# Patient Record
Sex: Male | Born: 1955 | Race: White | Hispanic: No | Marital: Married | Smoking: Former smoker
Health system: Southern US, Community
[De-identification: ages and names within clinical notes are randomized; demographics above are authoritative.]

## PROBLEM LIST (undated history)

## (undated) DIAGNOSIS — F4310 Post-traumatic stress disorder, unspecified: Secondary | ICD-10-CM

## (undated) DIAGNOSIS — E669 Obesity, unspecified: Secondary | ICD-10-CM

## (undated) DIAGNOSIS — I2510 Atherosclerotic heart disease of native coronary artery without angina pectoris: Secondary | ICD-10-CM

## (undated) DIAGNOSIS — G510 Bell's palsy: Secondary | ICD-10-CM

## (undated) DIAGNOSIS — IMO0002 Reserved for concepts with insufficient information to code with codable children: Secondary | ICD-10-CM

## (undated) DIAGNOSIS — K859 Acute pancreatitis without necrosis or infection, unspecified: Secondary | ICD-10-CM

## (undated) DIAGNOSIS — G4736 Sleep related hypoventilation in conditions classified elsewhere: Secondary | ICD-10-CM

## (undated) DIAGNOSIS — E785 Hyperlipidemia, unspecified: Secondary | ICD-10-CM

## (undated) DIAGNOSIS — R0600 Dyspnea, unspecified: Secondary | ICD-10-CM

## (undated) DIAGNOSIS — I10 Essential (primary) hypertension: Secondary | ICD-10-CM

## (undated) DIAGNOSIS — G4700 Insomnia, unspecified: Secondary | ICD-10-CM

## (undated) DIAGNOSIS — I6529 Occlusion and stenosis of unspecified carotid artery: Secondary | ICD-10-CM

## (undated) DIAGNOSIS — J302 Other seasonal allergic rhinitis: Secondary | ICD-10-CM

## (undated) HISTORY — PX: TONSILLECTOMY: SUR1361

## (undated) HISTORY — DX: Hyperlipidemia, unspecified: E78.5

## (undated) HISTORY — PX: APPENDECTOMY: SHX54

## (undated) HISTORY — DX: Atherosclerotic heart disease of native coronary artery without angina pectoris: I25.10

## (undated) HISTORY — DX: Other seasonal allergic rhinitis: J30.2

## (undated) HISTORY — PX: LUMBAR DISC SURGERY: SHX700

## (undated) HISTORY — DX: Reserved for concepts with insufficient information to code with codable children: IMO0002

## (undated) HISTORY — DX: Dyspnea, unspecified: R06.00

## (undated) HISTORY — DX: Post-traumatic stress disorder, unspecified: F43.10

## (undated) HISTORY — PX: CARDIAC CATHETERIZATION: SHX172

## (undated) HISTORY — DX: Essential (primary) hypertension: I10

## (undated) HISTORY — DX: Obesity, unspecified: E66.9

## (undated) HISTORY — DX: Bell's palsy: G51.0

## (undated) HISTORY — DX: Occlusion and stenosis of unspecified carotid artery: I65.29

## (undated) HISTORY — DX: Insomnia, unspecified: G47.00

## (undated) HISTORY — DX: Acute pancreatitis without necrosis or infection, unspecified: K85.90

## (undated) HISTORY — PX: CERVICAL SPINE SURGERY: SHX589

## (undated) HISTORY — DX: Sleep related hypoventilation in conditions classified elsewhere: G47.36

---

## 2004-05-07 ENCOUNTER — Encounter: Admission: RE | Admit: 2004-05-07 | Discharge: 2004-05-07 | Payer: Self-pay | Admitting: Internal Medicine

## 2004-05-22 ENCOUNTER — Ambulatory Visit (HOSPITAL_BASED_OUTPATIENT_CLINIC_OR_DEPARTMENT_OTHER): Admission: RE | Admit: 2004-05-22 | Discharge: 2004-05-22 | Payer: Self-pay | Admitting: Internal Medicine

## 2004-07-25 ENCOUNTER — Ambulatory Visit: Payer: Self-pay | Admitting: Internal Medicine

## 2004-10-16 ENCOUNTER — Ambulatory Visit: Payer: Self-pay | Admitting: Family Medicine

## 2005-05-06 ENCOUNTER — Ambulatory Visit: Payer: Self-pay | Admitting: Internal Medicine

## 2005-05-25 ENCOUNTER — Ambulatory Visit: Payer: Self-pay

## 2005-06-08 ENCOUNTER — Ambulatory Visit: Payer: Self-pay | Admitting: Internal Medicine

## 2005-06-22 ENCOUNTER — Ambulatory Visit: Payer: Self-pay | Admitting: Internal Medicine

## 2005-06-23 ENCOUNTER — Ambulatory Visit (HOSPITAL_COMMUNITY): Admission: RE | Admit: 2005-06-23 | Discharge: 2005-06-23 | Payer: Self-pay | Admitting: Internal Medicine

## 2005-07-01 ENCOUNTER — Ambulatory Visit: Payer: Self-pay | Admitting: Internal Medicine

## 2005-08-28 ENCOUNTER — Ambulatory Visit: Payer: Self-pay | Admitting: Internal Medicine

## 2005-10-09 ENCOUNTER — Ambulatory Visit: Payer: Self-pay | Admitting: Internal Medicine

## 2005-11-12 ENCOUNTER — Ambulatory Visit: Payer: Self-pay | Admitting: Internal Medicine

## 2005-11-13 ENCOUNTER — Ambulatory Visit: Payer: Self-pay | Admitting: Internal Medicine

## 2005-12-09 ENCOUNTER — Ambulatory Visit: Payer: Self-pay | Admitting: Internal Medicine

## 2005-12-21 ENCOUNTER — Ambulatory Visit: Payer: Self-pay | Admitting: Internal Medicine

## 2005-12-22 ENCOUNTER — Ambulatory Visit (HOSPITAL_COMMUNITY): Admission: RE | Admit: 2005-12-22 | Discharge: 2005-12-22 | Payer: Self-pay | Admitting: Internal Medicine

## 2006-01-07 ENCOUNTER — Ambulatory Visit: Payer: Self-pay | Admitting: Cardiology

## 2006-01-29 ENCOUNTER — Ambulatory Visit: Payer: Self-pay | Admitting: Internal Medicine

## 2006-05-07 ENCOUNTER — Ambulatory Visit: Payer: Self-pay | Admitting: Internal Medicine

## 2006-05-10 ENCOUNTER — Encounter: Payer: Self-pay | Admitting: Internal Medicine

## 2006-05-20 ENCOUNTER — Ambulatory Visit: Payer: Self-pay | Admitting: Internal Medicine

## 2006-06-02 ENCOUNTER — Encounter: Admission: RE | Admit: 2006-06-02 | Discharge: 2006-06-02 | Payer: Self-pay | Admitting: Neurosurgery

## 2006-06-17 ENCOUNTER — Ambulatory Visit: Payer: Self-pay | Admitting: Internal Medicine

## 2006-06-17 ENCOUNTER — Encounter: Payer: Self-pay | Admitting: Cardiology

## 2006-07-07 ENCOUNTER — Ambulatory Visit: Payer: Self-pay | Admitting: Internal Medicine

## 2006-08-02 ENCOUNTER — Ambulatory Visit: Payer: Self-pay | Admitting: Internal Medicine

## 2006-08-06 ENCOUNTER — Ambulatory Visit: Payer: Self-pay | Admitting: Internal Medicine

## 2006-08-12 ENCOUNTER — Ambulatory Visit (HOSPITAL_COMMUNITY): Admission: RE | Admit: 2006-08-12 | Discharge: 2006-08-12 | Payer: Self-pay | Admitting: Neurosurgery

## 2006-08-20 ENCOUNTER — Ambulatory Visit: Payer: Self-pay | Admitting: Internal Medicine

## 2006-09-01 ENCOUNTER — Encounter: Admission: RE | Admit: 2006-09-01 | Discharge: 2006-09-01 | Payer: Self-pay | Admitting: Neurosurgery

## 2006-10-08 ENCOUNTER — Ambulatory Visit: Payer: Self-pay | Admitting: Internal Medicine

## 2007-01-24 ENCOUNTER — Ambulatory Visit: Payer: Self-pay | Admitting: Internal Medicine

## 2007-01-24 LAB — CONVERTED CEMR LAB
ALT: 48 units/L — ABNORMAL HIGH (ref 0–40)
AST: 28 units/L (ref 0–37)
Albumin: 4.1 g/dL (ref 3.5–5.2)
Alkaline Phosphatase: 52 units/L (ref 39–117)
BUN: 9 mg/dL (ref 6–23)
Bilirubin, Direct: 0.1 mg/dL (ref 0.0–0.3)
CO2: 31 meq/L (ref 19–32)
Calcium: 8.6 mg/dL (ref 8.4–10.5)
Chloride: 109 meq/L (ref 96–112)
Cholesterol: 94 mg/dL (ref 0–200)
Creatinine, Ser: 0.7 mg/dL (ref 0.4–1.5)
GFR calc Af Amer: 153 mL/min
GFR calc non Af Amer: 126 mL/min
Glucose, Bld: 160 mg/dL — ABNORMAL HIGH (ref 70–99)
HDL: 23.4 mg/dL — ABNORMAL LOW (ref >39.0)
LDL Cholesterol: 48 mg/dL (ref 0–99)
Potassium: 4.1 meq/L (ref 3.5–5.1)
Sodium: 144 meq/L (ref 135–145)
Total Bilirubin: 0.6 mg/dL (ref 0.3–1.2)
Total CHOL/HDL Ratio: 4
Total Protein: 6.8 g/dL (ref 6.0–8.3)
Triglycerides: 111 mg/dL (ref 0–149)
VLDL: 22 mg/dL (ref 0–40)

## 2007-02-04 ENCOUNTER — Ambulatory Visit: Payer: Self-pay | Admitting: Internal Medicine

## 2007-02-15 DIAGNOSIS — I6529 Occlusion and stenosis of unspecified carotid artery: Secondary | ICD-10-CM

## 2007-02-15 HISTORY — DX: Occlusion and stenosis of unspecified carotid artery: I65.29

## 2007-02-25 ENCOUNTER — Ambulatory Visit: Payer: Self-pay

## 2007-06-13 ENCOUNTER — Ambulatory Visit: Payer: Self-pay | Admitting: Internal Medicine

## 2007-06-13 LAB — CONVERTED CEMR LAB
ALT: 61 units/L — ABNORMAL HIGH (ref 0–53)
AST: 32 units/L (ref 0–37)
Albumin: 4 g/dL (ref 3.5–5.2)
Alkaline Phosphatase: 51 units/L (ref 39–117)
BUN: 8 mg/dL (ref 6–23)
Bilirubin, Direct: 0.1 mg/dL (ref 0.0–0.3)
CO2: 31 meq/L (ref 19–32)
Calcium: 8.8 mg/dL (ref 8.4–10.5)
Chloride: 104 meq/L (ref 96–112)
Cholesterol: 108 mg/dL (ref 0–200)
Creatinine, Ser: 0.7 mg/dL (ref 0.4–1.5)
GFR calc Af Amer: 153 mL/min
GFR calc non Af Amer: 126 mL/min
Glucose, Bld: 168 mg/dL — ABNORMAL HIGH (ref 70–99)
HDL: 23.9 mg/dL — ABNORMAL LOW (ref >39.0)
LDL Cholesterol: 48 mg/dL (ref 0–99)
Potassium: 3.2 meq/L — ABNORMAL LOW (ref 3.5–5.1)
Sodium: 142 meq/L (ref 135–145)
Total Bilirubin: 0.7 mg/dL (ref 0.3–1.2)
Total CHOL/HDL Ratio: 4.5
Total Protein: 6.8 g/dL (ref 6.0–8.3)
Triglycerides: 183 mg/dL — ABNORMAL HIGH (ref 0–149)
VLDL: 37 mg/dL (ref 0–40)

## 2007-06-15 ENCOUNTER — Ambulatory Visit: Payer: Self-pay | Admitting: Internal Medicine

## 2007-09-19 ENCOUNTER — Ambulatory Visit: Payer: Self-pay | Admitting: Internal Medicine

## 2007-09-19 LAB — CONVERTED CEMR LAB
ALT: 50 units/L (ref 0–53)
AST: 28 units/L (ref 0–37)
Albumin: 4 g/dL (ref 3.5–5.2)
Alkaline Phosphatase: 51 units/L (ref 39–117)
BUN: 9 mg/dL (ref 6–23)
Bilirubin, Direct: 0.1 mg/dL (ref 0.0–0.3)
CO2: 30 meq/L (ref 19–32)
Calcium: 8.7 mg/dL (ref 8.4–10.5)
Chloride: 102 meq/L (ref 96–112)
Cholesterol: 111 mg/dL (ref 0–200)
Creatinine, Ser: 0.7 mg/dL (ref 0.4–1.5)
GFR calc Af Amer: 152 mL/min
GFR calc non Af Amer: 126 mL/min
Glucose, Bld: 230 mg/dL — ABNORMAL HIGH (ref 70–99)
HDL: 29.3 mg/dL — ABNORMAL LOW (ref >39.0)
LDL Cholesterol: 61 mg/dL (ref 0–99)
Potassium: 3.2 meq/L — ABNORMAL LOW (ref 3.5–5.1)
Sodium: 140 meq/L (ref 135–145)
Total Bilirubin: 0.7 mg/dL (ref 0.3–1.2)
Total CHOL/HDL Ratio: 3.8
Total Protein: 6.9 g/dL (ref 6.0–8.3)
Triglycerides: 102 mg/dL (ref 0–149)
VLDL: 20 mg/dL (ref 0–40)

## 2007-09-27 ENCOUNTER — Ambulatory Visit: Payer: Self-pay | Admitting: Internal Medicine

## 2007-09-27 LAB — CONVERTED CEMR LAB
BUN: 10 mg/dL (ref 6–23)
CO2: 32 meq/L (ref 19–32)
Calcium: 9 mg/dL (ref 8.4–10.5)
Chloride: 102 meq/L (ref 96–112)
Creatinine, Ser: 0.7 mg/dL (ref 0.4–1.5)
GFR calc Af Amer: 152 mL/min
GFR calc non Af Amer: 126 mL/min
Glucose, Bld: 181 mg/dL — ABNORMAL HIGH (ref 70–99)
Potassium: 3.6 meq/L (ref 3.5–5.1)
Sodium: 142 meq/L (ref 135–145)

## 2007-10-25 ENCOUNTER — Ambulatory Visit: Payer: Self-pay | Admitting: Internal Medicine

## 2008-05-03 ENCOUNTER — Ambulatory Visit: Payer: Self-pay | Admitting: Internal Medicine

## 2008-05-17 DIAGNOSIS — R0600 Dyspnea, unspecified: Secondary | ICD-10-CM

## 2008-05-17 HISTORY — DX: Dyspnea, unspecified: R06.00

## 2008-05-25 ENCOUNTER — Ambulatory Visit: Payer: Self-pay

## 2008-08-07 ENCOUNTER — Ambulatory Visit: Payer: Self-pay | Admitting: Internal Medicine

## 2008-08-24 ENCOUNTER — Ambulatory Visit: Payer: Self-pay | Admitting: Internal Medicine

## 2008-09-14 ENCOUNTER — Encounter: Payer: Self-pay | Admitting: Neurology

## 2008-09-27 ENCOUNTER — Encounter: Payer: Self-pay | Admitting: Internal Medicine

## 2008-09-28 ENCOUNTER — Encounter: Payer: Self-pay | Admitting: Internal Medicine

## 2008-10-04 ENCOUNTER — Encounter: Payer: Self-pay | Admitting: Internal Medicine

## 2008-12-07 ENCOUNTER — Ambulatory Visit: Payer: Self-pay | Admitting: Internal Medicine

## 2008-12-07 DIAGNOSIS — G4733 Obstructive sleep apnea (adult) (pediatric): Secondary | ICD-10-CM | POA: Insufficient documentation

## 2008-12-11 ENCOUNTER — Encounter: Payer: Self-pay | Admitting: Internal Medicine

## 2008-12-27 ENCOUNTER — Telehealth (INDEPENDENT_AMBULATORY_CARE_PROVIDER_SITE_OTHER): Payer: Self-pay | Admitting: *Deleted

## 2009-01-18 ENCOUNTER — Ambulatory Visit: Payer: Self-pay | Admitting: Internal Medicine

## 2009-01-18 DIAGNOSIS — I10 Essential (primary) hypertension: Secondary | ICD-10-CM | POA: Insufficient documentation

## 2009-03-22 ENCOUNTER — Encounter: Payer: Self-pay | Admitting: Internal Medicine

## 2009-05-10 ENCOUNTER — Ambulatory Visit: Payer: Self-pay | Admitting: Internal Medicine

## 2009-05-10 DIAGNOSIS — I6529 Occlusion and stenosis of unspecified carotid artery: Secondary | ICD-10-CM | POA: Insufficient documentation

## 2009-05-17 ENCOUNTER — Ambulatory Visit: Payer: Self-pay

## 2009-05-22 ENCOUNTER — Encounter: Payer: Self-pay | Admitting: Internal Medicine

## 2009-07-17 DIAGNOSIS — G510 Bell's palsy: Secondary | ICD-10-CM

## 2009-07-17 HISTORY — DX: Bell's palsy: G51.0

## 2009-07-29 ENCOUNTER — Ambulatory Visit (HOSPITAL_COMMUNITY): Admission: RE | Admit: 2009-07-29 | Discharge: 2009-07-29 | Payer: Self-pay | Admitting: Internal Medicine

## 2009-11-01 ENCOUNTER — Telehealth: Payer: Self-pay | Admitting: Internal Medicine

## 2009-11-08 ENCOUNTER — Ambulatory Visit: Payer: Self-pay | Admitting: Internal Medicine

## 2009-11-08 DIAGNOSIS — F329 Major depressive disorder, single episode, unspecified: Secondary | ICD-10-CM | POA: Insufficient documentation

## 2009-11-14 ENCOUNTER — Ambulatory Visit: Payer: Self-pay | Admitting: Psychology

## 2009-11-18 ENCOUNTER — Telehealth: Payer: Self-pay | Admitting: Internal Medicine

## 2009-11-19 ENCOUNTER — Telehealth: Payer: Self-pay | Admitting: Internal Medicine

## 2009-11-22 ENCOUNTER — Ambulatory Visit: Payer: Self-pay | Admitting: Internal Medicine

## 2009-11-22 ENCOUNTER — Ambulatory Visit (HOSPITAL_COMMUNITY): Admission: RE | Admit: 2009-11-22 | Discharge: 2009-11-22 | Payer: Self-pay | Admitting: Internal Medicine

## 2009-11-22 ENCOUNTER — Encounter: Payer: Self-pay | Admitting: Internal Medicine

## 2009-11-22 ENCOUNTER — Ambulatory Visit: Payer: Self-pay

## 2009-11-25 LAB — CONVERTED CEMR LAB
BUN: 14 mg/dL (ref 6–23)
CO2: 25 meq/L (ref 19–32)
Calcium: 8.8 mg/dL (ref 8.4–10.5)
Chloride: 103 meq/L (ref 96–112)
Creatinine, Ser: 0.97 mg/dL (ref 0.40–1.50)
Glucose, Bld: 210 mg/dL — ABNORMAL HIGH (ref 70–99)
Potassium: 3.7 meq/L (ref 3.5–5.3)
Sodium: 140 meq/L (ref 135–145)

## 2009-11-27 ENCOUNTER — Ambulatory Visit: Payer: Self-pay | Admitting: Psychology

## 2009-11-28 ENCOUNTER — Encounter (INDEPENDENT_AMBULATORY_CARE_PROVIDER_SITE_OTHER): Payer: Self-pay | Admitting: *Deleted

## 2009-12-04 ENCOUNTER — Telehealth: Payer: Self-pay | Admitting: Internal Medicine

## 2009-12-20 ENCOUNTER — Ambulatory Visit: Payer: Self-pay | Admitting: Psychology

## 2010-01-31 ENCOUNTER — Ambulatory Visit: Payer: Self-pay | Admitting: Internal Medicine

## 2010-01-31 DIAGNOSIS — E785 Hyperlipidemia, unspecified: Secondary | ICD-10-CM | POA: Insufficient documentation

## 2010-02-13 ENCOUNTER — Telehealth: Payer: Self-pay | Admitting: Internal Medicine

## 2010-04-22 ENCOUNTER — Telehealth: Payer: Self-pay | Admitting: Internal Medicine

## 2010-06-04 ENCOUNTER — Encounter: Payer: Self-pay | Admitting: Internal Medicine

## 2010-06-05 ENCOUNTER — Ambulatory Visit: Payer: Self-pay | Admitting: Internal Medicine

## 2010-06-05 ENCOUNTER — Encounter (INDEPENDENT_AMBULATORY_CARE_PROVIDER_SITE_OTHER): Payer: Self-pay | Admitting: *Deleted

## 2010-06-05 DIAGNOSIS — R0789 Other chest pain: Secondary | ICD-10-CM | POA: Insufficient documentation

## 2010-06-05 DIAGNOSIS — I25119 Atherosclerotic heart disease of native coronary artery with unspecified angina pectoris: Secondary | ICD-10-CM

## 2010-06-06 ENCOUNTER — Encounter: Payer: Self-pay | Admitting: Internal Medicine

## 2010-06-06 ENCOUNTER — Ambulatory Visit: Payer: Self-pay

## 2010-06-07 ENCOUNTER — Encounter: Payer: Self-pay | Admitting: Cardiology

## 2010-06-13 ENCOUNTER — Ambulatory Visit: Payer: Self-pay | Admitting: Cardiology

## 2010-06-13 ENCOUNTER — Ambulatory Visit (HOSPITAL_COMMUNITY)
Admission: RE | Admit: 2010-06-13 | Discharge: 2010-06-14 | Payer: Self-pay | Source: Home / Self Care | Admitting: Internal Medicine

## 2010-06-18 ENCOUNTER — Encounter: Payer: Self-pay | Admitting: Internal Medicine

## 2010-06-20 ENCOUNTER — Encounter: Payer: Self-pay | Admitting: Internal Medicine

## 2010-06-23 ENCOUNTER — Telehealth: Payer: Self-pay | Admitting: Internal Medicine

## 2010-07-07 ENCOUNTER — Ambulatory Visit: Payer: Self-pay | Admitting: Physician Assistant

## 2010-07-07 DIAGNOSIS — E119 Type 2 diabetes mellitus without complications: Secondary | ICD-10-CM | POA: Insufficient documentation

## 2010-07-14 ENCOUNTER — Encounter: Payer: Self-pay | Admitting: Internal Medicine

## 2010-07-28 ENCOUNTER — Telehealth: Payer: Self-pay | Admitting: Internal Medicine

## 2010-09-05 ENCOUNTER — Ambulatory Visit
Admission: RE | Admit: 2010-09-05 | Discharge: 2010-09-05 | Payer: Self-pay | Source: Home / Self Care | Attending: Internal Medicine | Admitting: Internal Medicine

## 2010-09-05 ENCOUNTER — Encounter: Payer: Self-pay | Admitting: Internal Medicine

## 2010-09-07 ENCOUNTER — Encounter: Payer: Self-pay | Admitting: Internal Medicine

## 2010-09-15 ENCOUNTER — Emergency Department (HOSPITAL_COMMUNITY)
Admission: EM | Admit: 2010-09-15 | Discharge: 2010-09-15 | Payer: Self-pay | Source: Home / Self Care | Admitting: Emergency Medicine

## 2010-09-15 LAB — BASIC METABOLIC PANEL
Chloride: 103 mEq/L (ref 96–112)
GFR calc Af Amer: >60 mL/min (ref >60)
GFR calc non Af Amer: >60 mL/min (ref >60)
Potassium: 3.6 mEq/L (ref 3.5–5.1)
Sodium: 143 mEq/L (ref 135–145)

## 2010-09-15 LAB — CK TOTAL AND CKMB (NOT AT ARMC)
CK, MB: 4.4 ng/mL — ABNORMAL HIGH (ref 0.3–4.0)
Relative Index: 2.8 — ABNORMAL HIGH (ref 0.0–2.5)
Total CK: 158 U/L (ref 7–232)
Total CK: 135 U/L (ref 7–232)

## 2010-09-15 LAB — DIFFERENTIAL
Basophils Absolute: 0 10*3/uL (ref 0.0–0.1)
Basophils Relative: 0 % (ref 0–1)
Eosinophils Relative: 6 % — ABNORMAL HIGH (ref 0–5)
Lymphocytes Relative: 22 % (ref 12–46)
Lymphs Abs: 1.8 10*3/uL (ref 0.7–4.0)
Monocytes Absolute: 0.6 10*3/uL (ref 0.1–1.0)
Monocytes Relative: 7 % (ref 3–12)
Neutro Abs: 5.5 10*3/uL (ref 1.7–7.7)
Neutrophils Relative %: 65 % (ref 43–77)

## 2010-09-15 LAB — CBC
HCT: 39.5 % (ref 39.0–52.0)
Hemoglobin: 13 g/dL (ref 13.0–17.0)
MCH: 26.2 pg (ref 26.0–34.0)
MCHC: 32.9 g/dL (ref 30.0–36.0)
Platelets: 242 10*3/uL (ref 150–400)
RBC: 4.97 MIL/uL (ref 4.22–5.81)
WBC: 8.4 10*3/uL (ref 4.0–10.5)

## 2010-09-15 LAB — PROTIME-INR
INR: 1.03 (ref 0.00–1.49)
Prothrombin Time: 13.7 seconds (ref 11.6–15.2)

## 2010-09-15 LAB — TROPONIN I: Troponin I: <0.01 ng/mL (ref 0.00–0.06)

## 2010-09-17 NOTE — Miscellaneous (Signed)
Summary: [facility] Progress Report   [facility] Progress Report   Imported By: Sallee Provencal 06/30/2010 14:58:00  _____________________________________________________________________  External Attachment:    Type:   Image     Comment:   External Document

## 2010-09-17 NOTE — Progress Notes (Signed)
Summary: elevated BP   Phone Note Call from Patient Call back at [phone]   Caller: Patient Reason for Call: Talk to Nurse Summary of Call: elevated bp.... sent readings in a couple weeks ago and has not heard anything. request call back asap Initial call taken by: Darnell Level,  November 01, 2009 9:15 AM  Follow-up for Phone Call        pt states he has sent Dr Haroldine Laws an email about his BP has not heard anything, advised Dr Haroldine Laws was on vacation and if he was having a problem calling the office would be the best thing to do.  Pt reports readings running 180/100, 190/90, and 167/100, he has been using a lot of extra clonidine and lasix to get it under control.  He states he took an extra lasix last pm and today his BP was 116/80,  Reviewed pts med list w/him and it is correct, he would like an appt to discuss w/Dr Bensimhon, pt sch an appt for 3/25 at [facility], RN  November 01, 2009 9:51 AM      Appended Document: elevated BP i have not gotten any emails recently. i will f/u with him.

## 2010-09-17 NOTE — Progress Notes (Signed)
Summary: had ultrasound at his office-anseurysm   Phone Note Call from Patient Call back at Home Phone [phone]   Caller: Patient [phone] Reason for Call: Talk to Nurse, Lab or Test Results Summary of Call: per pt calling, pt having a ultrasound at his office today, it reveal pt has a aneurysm at the sight where ts went in. pt would like a call back today. advise you can interpert him while he's in clinic.  Initial call taken by: Neil Crouch,  June 23, 2010 11:19 AM Caller: Receptionist  Follow-up for Phone Call        Left message to call back Kevan Rosebush, RN  June 23, 2010 2:22 PM   PT RETURNING CALL  CAN HAVE SOMEONE TO COME GET THE PT  .SIGN  Additional Follow-up for Phone Call Additional follow up Details #1::        Dr  Haroldine Laws has spoken w/pt and discussed Kevan Rosebush, RN  June 23, 2010 6:03 PM

## 2010-09-17 NOTE — Progress Notes (Signed)
Summary: [facility] At [facility] Vitals   [facility] By: Sallee Provencal 11/15/2009 11:17:00  _____________________________________________________________________  External Attachment:    Type:   Image     Comment:   External Document

## 2010-09-17 NOTE — Miscellaneous (Signed)
Summary: [place] Physician Order/Treatment Plan   [facility] Physician Order/Treatment Plan   Imported By: Sallee Provencal 06/30/2010 14:52:05  _____________________________________________________________________  External Attachment:    Type:   Image     Comment:   External Document

## 2010-09-17 NOTE — Progress Notes (Signed)
Summary: refill -- clonidine   Phone Note Refill Request   Refills Requested: Medication #1:  CLONIDINE HCL 0.2 MG TABS four times a day   [facility]ed[facility] [address]   Method Requested: Fax to [place] Initial call taken by: Darnell Level,  November 18, 2009 12:15 PM Caller: Patient Reason for Call: Talk to Nurse    Prescriptions: CLONIDINE HCL 0.2 MG TABS (CLONIDINE HCL) three times a day  #90 Tablet x 5   Entered by:   Mignon Pine, RMA   Authorized by:   Jolaine Artist, MD, [facility]   Signed by:   Mignon Pine, RMA on 11/18/2009   Method used:   Electronically to        [id number] (retail)       [place]       [place], [place]  [address]       Ph: [phone] or [phone]       Fax: [phone]   RxID:   [id number]

## 2010-09-17 NOTE — Letter (Signed)
Summary: Cardiac Catheterization Instructions- Main Lab  [place], [place]  [address] [place]   [place], [place] [address]   Phone: [phone]  Fax: [phone]     06/05/2010 MRN: [id number]  [facility] [place], [place]  [address]  Dear Mr. Trevathan,   You are scheduled for Cardiac Catheterization on Friday 06/13/10             with Dr. Haroldine Laws.  Please arrive at the [facility] at 2:00     p.m. on the day of your procedure.  1. DIET     _x___ You may have clear liquids until 10:00am the morning of your procedure, after that, nothing to eat or drink except medications.  2. Come to the [place] office on (N/A- labs will be drawn at the hospital on your procedure day)    for lab work.  The lab at [facility] is open from 8:30 a.m. to 1:30 p.m. and 2:30 p.m. to 5:00 p.m.  The lab at [facility] [facility] is open from 7:30 a.m. to 5:30 p.m.  You do not have to be fasting.  3. MAKE SURE YOU TAKE YOUR ASPIRIN.  4. __x___ DO NOT TAKE these medications the morning of your procedure:         1) hold lasix, 2) hold glucovance the morning of your procedure and 48 hours after, 3) take a 1/2 of your usual insulin dose the morning of your procedure.      _x___ YOU MAY TAKE ALL of your remaining medications with a small amount of water.     5. Plan for one night stay - bring personal belongings (i.e. toothpaste, toothbrush, etc.)  6. Bring a current list of your medications and current insurance cards.  7. Must have a responsible person to drive you home.   8. Someone must be with you for the first 24 hours after you arrive home.  9. Please wear clothes that are easy to get on and off and wear slip-on shoes.  *Special note: Every effort is made to have your procedure done on time.  Occasionally there are emergencies that present themselves at the hospital that may cause delays.  Please be patient if a  delay does occur.  If you have any questions after you get home, please call the office at the number listed above.  Alvis Lemmings, RN, BSN

## 2010-09-17 NOTE — Assessment & Plan Note (Signed)
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  He has a history of hypertension, diabetes, hyperlipidemia, OSA and obesity.    Echo 4/11 with normal LV function, grade 2 diastolic dysfunction and no pericardial effusion.  Also recently has been having some chest twinges and has been interested in discussiing cardiac catheterization.   Very concerned about hearing bruit like sounds in both ears only when he exercise. Saw ENT and had several MRIs/MRAs with only mild plaque. Continues with chest twinges/pain. Very concerned about safety of exercise. BP high at times. Sleeping better after cutting back lunesta. Compliant with meds.      Current Medications (verified): 1)  Minoxidil 10 Mg Tabs (Minoxidil) .... Two Times A Day 2)  Lotrel 10-40 Mg Caps (Amlodipine Besy-Benazepril Hcl) .... Once Daily 3)  Coreg Cr 40 Mg Xr24h-Cap (Carvedilol Phosphate) .... Once Daily 4)  Clonidine Hcl 0.2 Mg Tabs (Clonidine Hcl) .Marland Kitchen.. 1 Tab in Am, 1 Tab At Noon, and 2 Tabs At Bedtime 5)  Furosemide 40 Mg Tabs (Furosemide) .... Take One Tablet By Mouth Daily. 6)  Klor-Con M20 20 Meq Cr-Tabs (Potassium Chloride Crys Cr) .Marland Kitchen.. 1 By Mouth As Needed With Lasix 7)  Lunesta 3 Mg Tabs (Eszopiclone) .... At Bedtime As Needed 8)  Glucovance 2.5-500 Mg Tabs (Glyburide-Metformin) .... 2 By Mouth Two Times A Day 9)  Novolog Mix 70/30 70-30 % Susp (Insulin Aspart Prot & Aspart) .... 75 Units Two Times A Day 10)  Bayer Aspirin Ec Low Dose 81 Mg Tbec (Aspirin) .... Take 1 By Mouth Once Daily 11)  Klonopin 0.5 Mg Tabs (Clonazepam) .... Take 1 By Mouth Two Times A Day 12)  Oxygen 2liters .... Sleep 13)  Hyzaar 100-25 Mg Tabs (Losartan Potassium-Hctz) .... Once Daily 14)  Fish Oil 1000 Mg Caps  (Omega-3 Fatty Acids) .... 2 Cap Once Daily 15)  Lipitor 20 Mg Tabs (Atorvastatin Calcium) .... Take One Tablet By Mouth Daily. 16)  Super B Complex .Marland Kitchen.. 1 Tab Once Daily  Allergies (verified): 1)  ! Morphine 2)  ! Augmentin 3)  ! Procardia 4)  ! 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   Vital Signs:  Patient profile:   55 year old male Weight:      237 pounds Pulse rate:   70 / minute BP sitting:   140 / 80  Physical Exam  General:  Well appearing. no resp difficulty HEENT: mild R facial droop otheriwse normal Neck: supple. no JVD. Carotids 2+ bilat; + bilat bruits L>R. No lymphadenopathy or thryomegaly appreciated. Cor: PMI nondisplaced. Regular rate & rhythm. No rubs, gallops, murmur. Lungs: clear Abdomen: soft, nontender, nondistended. No hepatosplenomegaly. No bruits or masses. Good bowel sounds. Extremities: no cyanosis, clubbing, rash, edema Neuro: alert & orientedx3, cranial nerves grossly intact. moves all 4 extremities w/o difficulty. affect pleasant  Impression & Recommendations:  Problem # 1:  CHEST TIGHTNESS-PRESSURE-OTHER (ICD-786.59) Have discussed risks and indications of cardiac cath vs stress testing and cardiac CT and he would like to proceed with cath.   Orders: Cardiac Catheterization (Cardiac Cath)  Problem # 2:  HYPERTENSION (ICD-401.9) BP stable in acceptable range. Continue current regimen.   Patient Instructions: 1)  Your physician has requested that you have a cardiac catheterization.  Cardiac catheterization is used to  diagnose and/or treat various heart conditions. Doctors may recommend this procedure for a number of different reasons. The most common reason is to evaluate chest pain. Chest pain can be a symptom of coronary artery disease (CAD), and cardiac catheterization can show whether plaque is narrowing or blocking your heart's arteries. This procedure is also used to evaluate the valves, as well as measure the blood flow and oxygen levels in different parts of your heart.  For further information please visit [email or web address].  Please follow instruction sheet, as given. 2)  Your physician recommends that you schedule a follow-up appointment in: 4 months. Prescriptions: FUROSEMIDE 40 MG TABS (FUROSEMIDE) Take one tablet by mouth daily.  #30 Tablet x 11   Entered by:   Alvis Lemmings, RN, BSN   Authorized by:   Jolaine Artist, MD, [facility]   Signed by:   Alvis Lemmings, RN, BSN on 06/05/2010   Method used:   Electronically to        [id number] (retail)       [place]       [place], [place]  [address]       Ph: [phone] or [phone]       Fax: [phone]   RxID:   [id number] CLONIDINE HCL 0.2 MG TABS (CLONIDINE HCL) 1 tab in AM, 1 tab at NOON, and 2 tabs at bedtime  #120 x 11   Entered by:   Alvis Lemmings, RN, BSN   Authorized by:   Jolaine Artist, MD, [facility]   Signed by:   Alvis Lemmings, RN, BSN on 06/05/2010   Method used:   Electronically to        [id number] (retail)       [place]       [place], [place]  [address]       Ph: [phone] or [phone]       Fax: [phone]   RxID:   [id number]

## 2010-09-17 NOTE — Progress Notes (Signed)
Summary: re catherization   Phone Note Call from Patient   Caller: Patient [phone] Reason for Call: Talk to Nurse Summary of Call: calling re cardiac cath-ready to schedule [phone] Initial call taken by: Lorenda Hatchet,  April 22, 2010 10:06 AM  Follow-up for Phone Call        spoke w/pt he states that Dr Haroldine Laws has been wanting him to get a cath due to some stange chest twinges he has, but he has not wanted to do that.  Now it is cont. and getting a little worse so he feels he should go ahead and get cath in future, he would like to have this done the week of Thanksgiving, advised might could be done on 11/23 but he needs to see Dr Haroldine Laws first appt sch for 10/20 at 8:30, if symptoms worsen before then he will call back Kevan Rosebush, RN  April 22, 2010 1:48 PM

## 2010-09-17 NOTE — Progress Notes (Signed)
Summary: simva and amlodipine interaction   Phone Note Other Incoming   Summary of Call: Received form from pharmacy regarding simvastatin and amlodipine interaction, called and spoke w/pt he states the pharmacy did not fill med so he has not started simva, will discuss w/Dr Mckynlee Luse and call pt back next week  Initial call taken by: Kevan Rosebush, RN,  February 13, 2010 5:22 PM  Follow-up for Phone Call        With amlodipine max dose of simva is 20 (he is on 40). Let's switch to lipitor 20. (if cannot toletrate go to Crestor 20).     Appended Document: simva and amlodipine interaction Left message to call back   Appended Document: simva and amlodipine interaction pt aware, new rx sent in for Lipitor 50m HKevan Rosebush RN  February 19, 2010 3:54 PM    Clinical Lists Changes  Medications: Changed medication from SIMVASTATIN 40 MG TABS (SIMVASTATIN) Take one tablet by mouth daily at bedtime to LIPITOR 20 MG TABS (ATORVASTATIN CALCIUM) Take one tablet by mouth daily. - Signed Rx of LIPITOR 20 MG TABS (ATORVASTATIN CALCIUM) Take one tablet by mouth daily.;  #30 x 6;  Signed;  Entered by: HKevan Rosebush RN;  Authorized by: DJolaine Artist MD, [facility]  Method used: Electronically to [id number] [address], [place]  [address] Ph: [phone] Fax: [phone]   Prescriptions: LIPITOR 20 MG TABS (ATORVASTATIN CALCIUM) Take one tablet by mouth daily.  #30 x 6   Entered by:   HKevan Rosebush RN   Authorized by:   DJolaine Artist MD, [facility]  Signed by:   HKevan Rosebush RN on 02/19/2010   Method used:   Electronically to        [id number] (retail)       [address]      [place]  [address]      Ph: [phone] [phone]      Fax: [phone]  RxID:   [id number]

## 2010-09-17 NOTE — Assessment & Plan Note (Signed)
Summary: eph  Medications Added KLOR-CON M20 20 MEQ CR-TABS (POTASSIUM CHLORIDE CRYS CR) once daily ( two times a day as needed ) LUNESTA 1 MG TABS (ESZOPICLONE) as needed ASPIRIN EC 325 MG TBEC (ASPIRIN) Take one tablet by mouth daily EFFIENT 10 MG TABS (PRASUGREL HCL) Take one tablet by mouth daily ANDRODERM 5 MG/24HR PT24 (TESTOSTERONE) 1 patch every 24hrs      Allergies Added:   Visit Type:  eph Primary Provider:  Lang Snow  CC:  chest pain-burning and .  History of Present Illness: Primary Cardiologist:  Dr. 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  This demonstrated 70-80% RCA stenosis.  It was decided to treat this with a Promus DES.  Platelet inhibition was low on Plavix and he was placed on Effient + ASA.  He returns for follow up.  Since his PCI, he noted a RFA pseudoaneurysm by u/s in his office.  This was compressed.  He showed me the follow up pictures.  There was complete resolution.  He denies further pain.  He denies any exertional chest pain or shortness of breath.  He denies orthopnea, PND or significant pedal edema.  He has had some chest burning.  This may be related to meals.  He seems to have noted more indigestion with Effient.  He has frequent fluctuations in his blood pressure.  We briefly discussed switching to Catapres patch.  He would like to defer this for now.  Current Medications (verified): 1)  Minoxidil 10 Mg Tabs (Minoxidil) .... Two Times A Day 2)  Lotrel 10-40 Mg Caps (Amlodipine Besy-Benazepril Hcl) .... Once Daily 3)  Coreg Cr 40 Mg Xr24h-Cap (Carvedilol Phosphate) .... Once Daily 4)  Clonidine Hcl 0.2 Mg Tabs (Clonidine Hcl) .Marland Kitchen.. 1 Tab in Am, 1 Tab At Noon, and 2 Tabs At Bedtime 5)  Furosemide 40 Mg Tabs (Furosemide) .... Take One Tablet By Mouth Daily. 6)  Klor-Con M20 20 Meq Cr-Tabs (Potassium Chloride Crys Cr) .... Once Daily ( Two Times A Day As Needed ) 7)  Lunesta 1 Mg Tabs  (Eszopiclone) .... As Needed 8)  Glucovance 2.5-500 Mg Tabs (Glyburide-Metformin) .... 2 By Mouth Two Times A Day 9)  Novolog Mix 70/30 70-30 % Susp (Insulin Aspart Prot & Aspart) .... 75 Units Two Times A Day 10)  Aspirin Ec 325 Mg Tbec (Aspirin) .... Take One Tablet By Mouth Daily 11)  Klonopin 0.5 Mg Tabs (Clonazepam) .... Take 1 By Mouth Two Times A Day 12)  Oxygen 2liters .... Sleep 13)  Hyzaar 100-25 Mg Tabs (Losartan Potassium-Hctz) .... Once Daily 14)  Fish Oil 1000 Mg Caps (Omega-3 Fatty Acids) .... 2 Cap Once Daily 15)  Lipitor 20 Mg Tabs (Atorvastatin Calcium) .... Take One Tablet By Mouth Daily. 16)  Super B Complex .Marland Kitchen.. 1 Tab Once Daily 17)  Effient 10 Mg Tabs (Prasugrel Hcl) .... Take One Tablet By Mouth Daily 18)  Androderm 5 Mg/24hr Pt24 (Testosterone) .Marland Kitchen.. 1 Patch Every 24hrs  Allergies (verified): 1)  ! Morphine 2)  ! Augmentin 3)  ! Procardia 4)  ! Amoxicillin  Past History:  Past Medical History: CAD   a.  cath 06/14/2010:  LAD 40-50%; CFX 30%; OM2 30%; RCA 70-80% (tx with DES), 50-60% distal   b.  Promus DES to RCA 06/14/2010 [facility] trial)   c.  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  Alcohol Use - yes Regular Exercise - yes Drug Use - no PA with [employer]  Vital Signs:  Patient profile:   55 year old male Height:      68.75 inches Weight:      238.75 pounds BMI:     35.64 Pulse rate:   68 / minute BP sitting:   116 / 62  (left arm) Cuff size:   regular  Vitals Entered By: Hansel Feinstein CMA (July 07, 2010 11:03 AM)  Physical  Exam  General:  Well nourished, well developed, in no acute distress HEENT: normal Neck: no JVD Cardiac:  normal S1, S2; RRR; no murmur Lungs:  clear to auscultation bilaterally, no wheezing, rhonchi or rales Abd: soft, nontender, no hepatomegaly Ext: no edema; Right Radial site without hematoma or bruit; R FA site without hematoma or bruit Vascular: no carotid  bruits Skin: warm and dry Neuro:  CNs 2-12 intact, no focal abnormalities noted    EKG  Procedure date:  07/07/2010  Findings:      Normal sinus rhythm Heart rate 68 Normal axis Nonspecific interventricular conduction to leg Nonspecific ST-T wave changes Tracing similar to 3.25.2011  Impression & Recommendations:  Problem # 1:  CORONARY ATHEROSCLEROSIS NATIVE CORONARY ARTERY (ICD-414.01)  s/p Promus DES to RCA Platelet inhibition was low on Plavix . . . treated with Effient + ASA. No anginal symptoms. Post cath pseudoaneurysm has resolved.   Orders: EKG w/ Interpretation ([id number])  His updated medication list for this problem includes:    Lotrel 10-40 Mg Caps (Amlodipine besy-benazepril hcl) ..... Once daily    Coreg Cr 40 Mg Xr24h-cap (Carvedilol phosphate) ..... Once daily    Aspirin Ec 325 Mg Tbec (Aspirin) .Marland Kitchen... Take one tablet by mouth daily    Effient 10 Mg Tabs (Prasugrel hcl) .Marland Kitchen... Take one tablet by mouth daily  Problem # 2:  HYPERTENSION (ICD-401.9)  Discussed catapres patch.  He would like to defer for now.  Problem # 3:  HYPERLIPIDEMIA-MIXED (YSA-630.4)  His updated medication list for this problem includes:    Lipitor 20 Mg Tabs (Atorvastatin calcium) .Marland Kitchen... Take one tablet by mouth daily.  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 Take one tablet by  mouth daily    Effient 10 Mg Tabs (Prasugrel hcl) .Marland Kitchen... Take one tablet by mouth daily  Patient Instructions: 1)  Your physician recommends that you schedule a follow-up appointment in: Pt. has an appointment with Dr. Haroldine Laws on Jan. 6th, 2012. 2)  Your physician recommends that you continue on your current medications as directed. Please refer to the Current Medication list given to you today.

## 2010-09-17 NOTE — Miscellaneous (Signed)
Summary: Orders Update  Clinical Lists Changes  Orders: Added new Test order of Carotid Duplex (Carotid Duplex) - Signed 

## 2010-09-17 NOTE — Cardiovascular Report (Signed)
Summary: Cath/Percutaneous Orders   Cath/Percutaneous Orders   Imported By: Sallee Provencal 06/13/2010 13:21:52  _____________________________________________________________________  External Attachment:    Type:   Image     Comment:   External Document

## 2010-09-17 NOTE — Progress Notes (Signed)
Summary: Calling about  Clonidine  Medications Added CLONIDINE HCL 0.2 MG TABS (CLONIDINE HCL) 1 tab in AM, 1 tab at NOON, and 2 tabs at bedtime       Phone Note Call from Patient Call back at Work Phone [phone]   Caller: Patient Summary of Call: Pt need  new prescription for Clonidine with more pills  pt said he is taking 4 to 5 pills a day and a 90 day supply is not lasting a whole month.so his insurance will not pay for refills. Initial call taken by: Delsa Sale,  November 19, 2009 12:23 PM  Follow-up for Phone Call        per Dr Haroldine Laws ok to give pt clonidine 0.34m three times a day and pt can cut tabs in half, pt is aware and ok w/that, however he does state that his BP has been unchanged since his last office visit will let Dr BVaughan Brownerknow HKevan Rosebush RN  November 22, 2009 11:15   per Dr BHaroldine Lawscna increase clonidine to 0.229mAM and NOON and 2 tabs at bedtime, pt aware HeKevan RosebushRN  November 22, 2009 4:13 PM     New/Updated Medications: CLONIDINE HCL 0.2 MG TABS (CLONIDINE HCL) 1 tab in AM, 1 tab at NOON, and 2 tabs at bedtime Prescriptions: CLONIDINE HCL 0.2 MG TABS (CLONIDINE HCL) 1 tab in AM, 1 tab at NOON, and 2 tabs at bedtime  #120 x 6   Entered by:   HeKevan RosebushRN   Authorized by:   DaJolaine ArtistMD, [facility] Signed by:   HeKevan RosebushRN on 11/22/2009   Method used:   Electronically to        [id number]       [address]     [place]  [address]     Ph: [phone]     Fax: [phone] RxID:   [id number]

## 2010-09-17 NOTE — Letter (Signed)
Summary: Appointment - Reschedule  [place], [place]    Phone:   Fax:      November 28, 2009 MRN: [id number]   [name], [place]  [address]   Dear Austin [name],   Due to a change in our office schedule, your appointment on 02-12-2010  at  8:15              must be changed.  It is very important that we reach you to reschedule this appointment. We look forward to participating in your health care needs. Please contact us at the number listed above at your earliest convenience to reschedule this appointment.     Sincerely,      [place] Scheduling Team

## 2010-09-17 NOTE — Progress Notes (Signed)
Summary: question / email dr. Haroldine Laws today   Phone Note Call from Patient Call back at Home Phone [phone] Call back at Work Phone [phone] Call back at [phone]   Caller: Patient Reason for Call: Talk to Nurse Summary of Call: per pt called for 2 part question  pt on scheduled to see dr. Haroldine Laws on 6/29 bump. offer 6/8 pt refused . pt also inform me that he will email dr. Haroldine Laws todau via email today. spoke with heather who is aware.  Initial call taken by: Neil Crouch,  December 04, 2009 11:15 AM  Follow-up for Phone Call        Pt calling back to inform Dr.Jaziyah Gradel to check his email Delsa Sale  December 04, 2009 4:35 PM  spoke w/pt he wants Dr Haroldine Laws to review his MRI, report printed from echart will have Dr Haroldine Laws review on 4/21 and look for pts email as well Kevan Rosebush, RN  December 04, 2009 5:11 PM   Additional Follow-up for Phone Call Additional follow up Details #1::        i did not get email. he needs to f/u with endocrinologist to discuss MRI.; Jolaine Artist, MD, [facility]  December 05, 2009 10:34 AM  pt aware Kevan Rosebush, RN  December 05, 2009 5:44 PM

## 2010-09-17 NOTE — Assessment & Plan Note (Signed)
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  He has a history of hypertension, diabetes, hyperlipidemia, OSA and obesity.   We saw him a few months ago and he was having a hard time with R facial droop due to Bell's palsy and nightmares/depression since December. We also changed lasix to spironolactone for his BP. F/u echo looked good with normal LV function, grade 2 diastolic dysfunction and no pericardial effusion.  Has cycles at night where he wakes up and has his HR pounding and BP is up. Takes extra dose of clonidine. Then when he wakes up SBP in 90s or low 100s. Often has to take extra doses of clondine at night.  Stopped SimCor due to flushing.   Spironolactone not controlling edema so having to take lasix every other day.       Current Medications (verified): 1)  Minoxidil 10 Mg Tabs (Minoxidil) .... Two Times A Day 2)  Lotrel 10-40 Mg Caps (Amlodipine Besy-Benazepril Hcl) .... Once Daily 3)  Coreg Cr 40 Mg Xr24h-Cap (Carvedilol Phosphate) .... Once Daily 4)  Clonidine Hcl 0.2 Mg Tabs (Clonidine Hcl) .Marland Kitchen.. 1 Tab in Am, 1 Tab At Noon, and 2 Tabs At Bedtime 5)  Spironolactone 25 Mg Tabs (Spironolactone) .... Take One Tablet By Mouth Daily 6)  Klor-Con M20 20 Meq Cr-Tabs (Potassium Chloride Crys Cr) .Marland Kitchen.. 1 By Mouth As Needed With Lasix 7)  Lunesta 3 Mg Tabs (Eszopiclone) .... At Bedtime As Needed 8)  Glucovance 2.5-500 Mg Tabs (Glyburide-Metformin) .... 2 By Mouth Two Times A Day 9)  Novolog Mix 70/30 70-30 % Susp (Insulin Aspart Prot & Aspart) .... 75 Units  Two Times A Day 10)  Bayer Aspirin Ec Low Dose 81 Mg Tbec (Aspirin) .... Take 1 By Mouth Once Daily 11)  Klonopin 0.5 Mg Tabs (Clonazepam) .... Take 1 By Mouth Two Times A Day 12)  Oxygen 2liters .... Sleep 13)  Hyzaar 100-25 Mg Tabs (Losartan Potassium-Hctz) .... Once Daily 14)  Allegra 180 Mg Tabs (Fexofenadine Hcl) .... At Bedtime 15)  Fish Oil 1000 Mg Caps (Omega-3 Fatty Acids) .... 2 Cap Once Daily  Allergies (verified): 1)  ! Morphine 2)  ! Augmentin 3)  ! Procardia 4)  ! Amoxicillin  Past History:  Past Medical History: Last updated: 11/08/2009 Hx pancreatitis Hyperlipidemia Hypertension Hypoventilation during sleep- former navy diver    --nocturnal desaturaion    --no apnea Obesity Dyspnea   --myoview 10/09: TM/myoview 63% no ischemia/scar Carotid u/s 7/08    --0-39% bilat  Bells Palsy 12/10 Insomina Probable PTSD  Vital Signs:  Patient profile:   55 year old male Height:      68.75 inches Weight:      239 pounds BMI:     35.68 Pulse rate:   68 / minute Resp:     18 per minute BP supine:   128 / 64  (left arm) BP sitting:   120 / 58  (right arm)  Vitals Entered By: Levora Angel, CNA (January 31, 2010 12:12 PM)  Physical Exam  General:  Gen: well  appearing. no resp difficulty HEENT: R facial droop otheriwse normal Neck: supple. no JVD. Carotids 2+ bilat; + bilat bruits L>R. No lymphadenopathy or thryomegaly appreciated. Cor: PMI nondisplaced. Regular rate & rhythm. No rubs, gallops, murmur. Lungs: clear Abdomen: soft, nontender, nondistended. No hepatosplenomegaly. No bruits or masses. Good bowel sounds. Extremities: no cyanosis, clubbing, rash, edema Neuro: alert & orientedx3, cranial nerves grossly intact. moves all 4 extremities w/o difficulty. affect pleasant    Impression & Recommendations:  Problem # 1:  HYPERTENSION (ICD-401.9) BP well controlled for the most part. Not sure what is causing his HTN at night. I wonder if it may be related to  nightmares and night terrors but he has followed his BP at night and he does have many readings where BP elevated in the absence of emotional. Will switch Lotrel from am to when he gets home.   Problem # 2:  [id number] (HKN-183.4) Unable to tolerate SimCor will switch to Simva 40.\  Patient Instructions: 1)  Your physician recommends that you schedule a follow-up appointment in: 6 months 2)  Your physician has recommended you make the following change in your medication: Stop simcor.  3)  Start simvastatin 40 mg by mouth daily. 4)  Take Lotrel at bedtime. Prescriptions: SIMVASTATIN 40 MG TABS (SIMVASTATIN) Take one tablet by mouth daily at bedtime  #30 x 11   Entered by:   Thompson Grayer, RN, BSN   Authorized by:   Jolaine Artist, MD, [facility]   Signed by:   Thompson Grayer, RN, BSN on 01/31/2010   Method used:   Electronically to        [id number] (retail)       [place]       [place],   [address]       Ph: [phone] or [phone]       Fax: [phone]   RxID:   [id number]

## 2010-09-17 NOTE — Assessment & Plan Note (Signed)
Summary: rov  Medications Added SPIRONOLACTONE 25 MG TABS (SPIRONOLACTONE) Take one tablet by mouth daily NOVOLOG MIX 70/30 70-30 % SUSP (INSULIN ASPART PROT & ASPART) 75 units two times a day SIMCOR 500-20 MG XR24H-TAB (NIACIN-SIMVASTATIN) 1 tab at bedtime FISH OIL 1000 MG CAPS (OMEGA-3 FATTY ACIDS) 2 cap once daily        Visit Type:  rov Primary Provider:  Lang Snow  CC:  Dyspnea with exertion.  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  He has a history of hypertension, diabetes, hyperlipidemia, OSA and obesity.   Has been sturggling recently. Has had a R facial droop due to Bell's palsy since December. Having trouble with insomnia and only sleeping about 4-5 hours per night. 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 [facility]. BP also up Early am BPs tend to be in 160-170. Average systolic seems to be about 140-150. Having lots of joint pain. Has been taking extra clonidine. Very fatigued. No CP. Occasional dyspnea.     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 Two Times A Day 2)  Lotrel 10-40 Mg Caps (Amlodipine Besy-Benazepril Hcl) .... Once Daily 3)  Coreg Cr 40 Mg Xr24h-Cap (Carvedilol Phosphate) .... Once Daily 4)  Clonidine Hcl 0.2 Mg Tabs (Clonidine Hcl) .... Three Times A Day 5)  Furosemide 40 Mg Tabs (Furosemide) .... Once Daily 6)  Klor-Con 20 Meq Pack (Potassium Chloride) .... Take One Tablet By Mouth Once Daily. 7)  Lunesta 3 Mg Tabs (Eszopiclone) .... At Bedtime As Needed 8)  Glucovance 2.5-500 Mg Tabs (Glyburide-Metformin) .... Two Times A Day 9)  Novolog Mix 70/30 70-30 % Susp (Insulin Aspart Prot & Aspart)  .... 75 Units Two Times A Day 10)  Bayer Aspirin Ec Low Dose 81 Mg Tbec (Aspirin) .... Take 1 By Mouth Once Daily 11)  Klonopin 0.5 Mg Tabs (Clonazepam) .... Take 1 By Mouth Two Times A Day 12)  Simcor 500-20 Mg Xr24h-Tab (Niacin-Simvastatin) .Marland Kitchen.. 1 Tab At Bedtime 13)  Oxygen 2liters .... Sleep 14)  Hyzaar 100-25 Mg Tabs (Losartan Potassium-Hctz) .... Once Daily 15)  Allegra 180 Mg Tabs (Fexofenadine Hcl) .... At Bedtime 16)  Fish Oil 1000 Mg Caps (Omega-3 Fatty Acids) .... 2 Cap Once Daily  Allergies: 1)  ! Morphine 2)  ! Augmentin 3)  ! Procardia 4)  ! Amoxicillin  Past History:  Past Medical History: Hx pancreatitis Hyperlipidemia Hypertension Hypoventilation during sleep- former Scientist, clinical (histocompatibility and immunogenetics)    --nocturnal desaturaion    --no apnea Obesity Dyspnea   --myoview 10/09: TM/myoview 63% no ischemia/scar Carotid u/s 7/08    --0-39% bilat  Bells Palsy 12/10 Insom[place] Probable PTSD  Review of Systems       As per HPI and past medical history; otherwise all systems negative.   Vital Signs:  Patient profile:   55 year old male Height:      68.75 inches Weight:      237 pounds BMI:     35.38 Pulse rate:   66 / minute Pulse rhythm:   regular BP sitting:   136 / 72  (left arm) Cuff size:   large  Vitals Entered By: Julaine Hua, CMA (November 08, 2009 11:09 AM)  Physical Exam  General:  Gen: well appearing. no resp difficulty HEENT: R facial droop otheriwse normal Neck: supple. no JVD. Carotids 2+ bilat; + bilat bruits L>R. No lymphadenopathy or thryomegaly appreciated. Cor: PMI nondisplaced. Regular rate & rhythm. No rubs, gallops, murmur. Lungs: clear Abdomen: soft, nontender, nondistended. No hepatosplenomegaly. No bruits or masses. Good bowel sounds. Extremities: no cyanosis, clubbing, rash, edema Neuro: alert & orientedx3, cranial nerves grossly intact. moves all 4 extremities w/o difficulty. affect pleasant    Impression & Recommendations:  Problem # 1:   HYPERTENSION (ICD-401.9)  BP inadequately controlled. Suspect some of it related to his mood and sleep deprication. Will change lasix to spironolactone 25 dail. check BMET 1 week. Check echo to r/o pericardial effusion related to minoxidil. Refer to Dr. Cheryln Manly for counseling.   Orders: Echocardiogram (Echo)  Problem # 2:  DEPRESSION (ICD-311) As above, refer to Dr. Cheryln Manly.  Patient Instructions: 1)  Stop Furosemide 2)  Start Spironolactone 24m daily 3)  Your physician has requested that you have an echocardiogram.  Echocardiography is a painless test that uses sound waves to create images of your heart. It provides your doctor with information about the size and shape of your heart and how well your heart's chambers and valves are working.  This procedure takes approximately one hour. There are no restrictions for this procedure. 4)  Labs with echo--bmet 401.1 5)  You have been referred to Dr GCheryln Manly please call his office for an appt. [phone])  Follow up in 3 months Prescriptions: SPIRONOLACTONE 25 MG TABS (SPIRONOLACTONE) Take one tablet by mouth daily  #30 x 6   Entered by:   HKevan Rosebush RN   Authorized by:   DJolaine Artist MD, [facility]  Signed by:   HKevan Rosebush RN on 11/08/2009   Method used:   Electronically to        [id number] (retail)       [address]      [place]  [id number]      Ph: [phone] [phone]      Fax: [phone]  RxID:   [id number]

## 2010-09-18 NOTE — Miscellaneous (Signed)
Summary: [facility] Progress Report   [facility] Progress Report   Imported By: Sallee Provencal 08/05/2010 11:56:26  _____________________________________________________________________  External Attachment:    Type:   Image     Comment:   External Document

## 2010-09-18 NOTE — Progress Notes (Signed)
Summary: talk to nurse    Phone Note Call from Patient   Caller: Patient Summary of Call: Pt wants to talk to Nurse concerning his cath and stent he had it done in October. [phone] Initial call taken by: Lorraine Lax,  July 28, 2010 12:03 PM  Follow-up for Phone Call        spoke w/pt he states that since his cath he has cont. to feel worse, increased SOB and fatigue, he states he feels "over medicated."  He states that he slept all weekend and has even fallen asleep at a stop light recently, he's not sure if is depression or something else going on, advised to start w/pcp and let us know what they say, he is agreeable Kevan Rosebush, RN  July 28, 2010 5:38 PM

## 2010-09-18 NOTE — Assessment & Plan Note (Signed)
Summary: 6 month rov/sl  Medications Added NOVOLOG MIX 70/30 70-30 % SUSP (INSULIN ASPART PROT & ASPART) 1-2 times a day * OXYGEN 2LITERS at bedtime as needed ANDROGEL PUMP 1.25 GM/ACT (1%) GEL (TESTOSTERONE) 4 pumps once daily      Allergies Added:   Visit Type:  Follow-up Primary Provider:  Lang Snow  CC:  chest twinges.  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  He has a history of hypertension, diabetes, hyperlipidemia, OSA, CAD and obesity.   Hs recently set up for cardiac cath due to chest pain.  This demonstrated 70-80% RCA stenosis.  It was decided to treat this with a Promus DES.  Platelet inhibition was low on Plavix and he was placed on Effient + ASA.  He returns for follow up. Since his PCI, he noted a RFA pseudoaneurysm by u/s in his office. This was compressed.  Carotid u/s 10/11: R 40-59% L 0-39%  Recently started on androgel for low testosterone.  He returns for routine f/u. Trying to wean off Lunesta. Feels he is sleeping better and not waking with nightmares as much. More rested. BP typically 130/80. Occasional "warmth" in chest but no angina. Continues with Effient. No bleeding noted.      Current Medications (verified): 1)  Minoxidil 10 Mg Tabs (Minoxidil) .... Two Times A Day 2)  Lotrel 10-40 Mg Caps (Amlodipine Besy-Benazepril Hcl) .... Once Daily 3)  Coreg Cr 40 Mg Xr24h-Cap (Carvedilol Phosphate) .... Once Daily 4)  Clonidine Hcl 0.2 Mg Tabs (Clonidine Hcl) .Marland Kitchen.. 1 Tab in Am, 1 Tab At Noon, and 2 Tabs At Bedtime 5)  Furosemide 40 Mg Tabs (Furosemide) .... Take One Tablet By Mouth Daily. 6)  Klor-Con M20 20 Meq Cr-Tabs (Potassium Chloride Crys Cr) .... Once Daily ( Two Times A Day As Needed ) 7)  Glucovance 2.5-500 Mg Tabs (Glyburide-Metformin) .... 2 By Mouth Two Times A Day 8)  Novolog Mix 70/30 70-30 % Susp (Insulin Aspart Prot & Aspart) .Marland Kitchen.. 1-2 Times A Day 9)   Aspirin Ec 325 Mg Tbec (Aspirin) .... Take One Tablet By Mouth Daily 10)  Klonopin 0.5 Mg Tabs (Clonazepam) .... Take 1 By Mouth Two Times A Day 11)  Oxygen 2liters .... At Bedtime As Needed 12)  Hyzaar 100-25 Mg Tabs (Losartan Potassium-Hctz) .... Once Daily 13)  Fish Oil 1000 Mg Caps (Omega-3 Fatty Acids) .... 2 Cap Once Daily 14)  Lipitor 20 Mg Tabs (Atorvastatin Calcium) .... Take One Tablet By Mouth Daily. 15)  Super B Complex .Marland Kitchen.. 1 Tab Once Daily 16)  Effient 10 Mg Tabs (Prasugrel Hcl) .... Take One Tablet By Mouth Daily 17)  Androgel Pump 1.25 Gm/act (1%) Gel (Testosterone) .... 4 Pumps Once Daily  Allergies (verified): 1)  ! Morphine 2)  ! Augmentin 3)  ! Procardia 4)  ! Amoxicillin  Past History:  Past Surgical History: Last updated: 01/18/2009 C5-6 diskectomy Appendectomy Tonsillectomy  Family History: Last updated: 12/21/2008 Family History of Hypertension:   Social History: Last updated: 07/07/2010 Retired  Disabled  Married  Tobacco Use - Former.  Alcohol Use - yes Regular Exercise - yes Drug Use - no PA with [employer]  Risk Factors: Exercise: yes (12/21/2008)  Risk Factors: Smoking Status: quit (12/21/2008)  Past Medical History: Reviewed history from 07/07/2010 and no changes required. CAD   a.  cath 06/14/2010:  LAD 40-50%; CFX 30%; OM2 30%; RCA 70-80% (tx with DES), 50-60%  distal   b.  Promus DES to RCA 06/14/2010 [facility] trial)   c.  EF 60% on cath Hx pancreatitis Hyperlipidemia Hypertension Diabetes Hypoventilation during sleep- former navy diver    --nocturnal desaturaion    --no apnea Obesity Dyspnea   --myoview 10/09: TM/myoview 63% no ischemia/scar Carotid u/s 7/08    --0-39% bilat     --f/u 05/2010: 79-89% RICA; 2-11% LICA Bells Palsy 94/17 Insomina Probable PTSD  Vital Signs:  Patient profile:   55 year old male Height:      68.75 inches Weight:      232 pounds BMI:     34.63 Pulse rate:   67  / minute BP sitting:   154 / 72  (left arm) Cuff size:   regular  Vitals Entered By: Mignon Pine, RMA (September 05, 2010 9:46 AM)  Physical Exam  General:  Well nourished, well developed, in no acute distress HEENT: normal Neck: no JVD Cardiac:  normal S1, S2; RRR; no murmur Lungs:  clear to auscultation bilaterally, no wheezing, rhonchi or rales Abd: soft, nontender, no hepatomegaly Ext: no edema; Right Radial site without hematoma or bruit; R FA site without hematoma or bruit Vascular: no carotid  bruits Skin: warm and dry Neuro:  CNs 2-12 intact, no focal abnormalities noted    Impression & Recommendations:  Problem # 1:  CORONARY ATHEROSCLEROSIS NATIVE CORONARY ARTERY (ICD-414.01) Stable. s/p PCI RCA. Continue Effient.   Problem # 2:  HYPERTENSION (ICD-401.9) Much better controlled. Continue current regimen.   Problem # 3:  CAROTID ARTERY STENOSIS, BILATERAL (ICD-433.10) Mild to moderate. Asymptomatic. Continue yearly u/s.   Other Orders: EKG w/ Interpretation (93000)  Patient Instructions: 1)  Your physician has requested that you have a carotid duplex. This test is an ultrasound of the carotid arteries in your neck. It looks at blood flow through these arteries that supply the brain with blood. Allow one hour for this exam. There are no restrictions or special instructions.  Needs in 6 months 2)  Your physician wants you to follow-up in:  6 months.  You will receive a reminder letter in the mail two months in advance. If you don't receive a letter, please call our office to schedule the follow-up appointment.

## 2010-09-22 NOTE — Consult Note (Signed)
Austin [name], TOROK NO.:  [id number]  MEDICAL RECORD NO.:  [id number]          PATIENT TYPE:  EMS  LOCATION:  [facility]                         FACILITY:  [facility]  PHYSICIAN:  Lauree Chandler, MDDATE OF BIRTH:  [birth year]  DATE OF CONSULTATION:  09/15/2010 DATE OF DISCHARGE:  09/15/2010                                CONSULTATION   PRIMARY CARDIOLOGIST:  Shaune Pascal. Bensimhon, MD  PRIMARY CARE PROVIDER:  Janalyn Rouse, MD  PATIENT PROFILE:  This is a 55 year old male with prior history of CAD status post drug-eluting stent placement to the RCA in October 1011 who presents with an episode of nitrate-responsive chest pain.  PROBLEM LIST: 1. Unstable angina/coronary artery disease.     a.     On June 14, 2010, cardiac catheterization showing 70-80%      right coronary artery stenosis with otherwise nonobstructive      disease.  The patient was treated with a Promus drug-eluting stent      in the right coronary artery.  He was enrolled in the [facility]      [place] trial. 2. Hypertension. 3. Hyperlipidemia. 4. Diabetes mellitus. 5. History of hypoventilation during sleep without apnea, previously     wearing O2 at night. 6. Obesity. 7. Carotid vascular disease.     a.     On May 23, 2010, carotid ultrasound showing 40-59%      stenosis on the right and 0-39% stenosis on the left. 8. History of Bell palsy in December 2010. 9. Insomnia. 10.(?)  posttraumatic stress disorder. 11.Status post C5-C6 diskectomy. 12.Status post appendectomy. 13.Status post tonsillectomy. 14.History of right femoral artery pseudoaneurysm following     catheterization October 2011, healed with compression.  ALLERGIES: 1. MORPHINE. 2. AUGMENTIN. 3. PROCARDIA. 4. AMOXICILLIN.  HISTORY OF PRESENT ILLNESS:  This is a 55 year old male with a history of CAD, status post drug-eluting stent placement to the RCA in October 2011.  He was in his usual state of health until today  when he was at work as a Librarian, academic and seeing the patient and suddenly developed left-sided chest fullness associated with a knot in the center of his back.  He felt warm, but denies dyspnea, diaphoresis, or nausea. He took 1 sublingual nitroglycerin tablet with relief of chest pain in approximately 3 minutes.  He discontinued to have a sensation of a knot in his back.  He presented to the [facility] ED where his enzymes so far negative and ECG is nonacute.  He wants to go home and would like to avoid admission and even more so avoid catheterization.  HOME MEDICATIONS: 1. Minoxidil 10 mg b.i.d. 2. Lotrel 10/40 mg daily. 3. Coreg CR 40 mg daily. 4. Clonidine 0.2 mg 1 tablet in the a.m., 1 tablet in lunch, and 2     tablets in the p.m. 5. Lasix 40 mg daily. 6. K-Dur 20 mEq daily. 7. Glucovance 2.5-500 mg 2 tablets b.i.d. 8. 70/30 insulin 1-2 times per day. 9. Aspirin 325 mg daily. 10.Clonidine 2.5 mg b.i.d. 11.Hyzaar 100-25 mg daily. 12.Fish oil 1000 mg 2 tablets daily. 13.Lipitor 20 mg daily. 14.Effient  10 mg daily. 15.AndroGel pump 1.25 mg 4 pumps daily.  FAMILY HISTORY:  Positive for hypertension.  SOCIAL HISTORY:  The patient lives in [place] with his wife.  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  He has prior history of tobacco use quitting about 14 years ago.  He denies alcohol or drug use.  He is not routinely exercising, although he has rejoined the gym.  REVIEW OF SYSTEMS:  Positive for chest pain as outlined in the HPI.  He is a full code.  Otherwise, all systems are reviewed and are negative.  PHYSICAL EXAMINATION:  VITAL SIGNS:  Temperature 98.4, heart rate 74, respirations 16, blood pressure 154/73, and pulse ox 95% on room air. GENERAL:  A pleasant white male in no acute distress.  Awake, alert, and oriented x3.  He has a normal affect. HEENT:  Normal. NEURO:  Grossly intact and nonfocal. SKIN:  Warm and dry without  lesions or masses. NECK:  Supple with a soft right carotid bruit.  No JVD. LUNGS:  Respirations are regular and unlabored.  Clear to auscultation. CARDIAC:  Regular S1 and S2 with a soft systolic murmur at the left upper sternal border. ABDOMEN:  Round, soft, nontender, and nondistended.  Bowel sounds present x4. EXTREMITIES:  Warm, dry, and pink.  No clubbing, cyanosis, or edema. Dorsalis pedis and posterior tibial pulses are 2+ and equal bilaterally.  Chest x-ray shows no acute disease.  EKG shows sinus rhythm, rate of 72, normal axis with T flattening in III and aVF, unchanged from prior ECGs. Hemoglobin 13.0, hematocrit 39.5, WBC 8.4, and platelets 242.  Sodium 143, potassium 3.6, chloride 103, CO2 of 26, BUN 8, creatinine 0.78, and glucose 79.  CK 158, MB 4.4, and troponin I less than 0.01.  INR 1.03.  ASSESSMENT/PLAN: 1. Chest pain, nitrate responsive.  The patient wished to avoid     admission and even more so avoid catheterization.  We will check     another set of enzymes here in the ED and if negative we will plan     to discharge home from here.  We will arrange for [facility]     on September 25, 2010, and follow up with Dr. Haroldine Laws on October 02, 2010.  If, however, enzymes are positive and the patient has     recurrent chest pain while in the ED, we would plan to admit and     probably cath in the a.m. 2. Hypertension.  Blood pressure is elevated here in the ED.  Continue     home meds and follow up as outpatient.  The patient is on multiple     medications. 3. Hyperlipidemia:  Continue statin therapy. 4. Diabetes mellitus:  If admitted, would hold metformin in     preparation for possible cath.     Murray Hodgkins, ANP   ______________________________ Lauree Chandler, MD    CB/MEDQ  D:  09/15/2010  T:  09/16/2010  Job:  [id number]  Electronically Signed by Murray Hodgkins ANP on 09/19/2010 04:23:05 PM Electronically Signed by Lauree Chandler MD on 09/22/2010 09:14:21 AM

## 2010-09-24 ENCOUNTER — Telehealth (INDEPENDENT_AMBULATORY_CARE_PROVIDER_SITE_OTHER): Payer: Self-pay | Admitting: *Deleted

## 2010-09-24 ENCOUNTER — Telehealth (INDEPENDENT_AMBULATORY_CARE_PROVIDER_SITE_OTHER): Payer: Self-pay | Admitting: Radiology

## 2010-09-25 ENCOUNTER — Encounter: Payer: Self-pay | Admitting: Internal Medicine

## 2010-09-25 ENCOUNTER — Ambulatory Visit (HOSPITAL_COMMUNITY): Attending: Internal Medicine

## 2010-09-25 DIAGNOSIS — R079 Chest pain, unspecified: Secondary | ICD-10-CM | POA: Insufficient documentation

## 2010-09-25 DIAGNOSIS — I2510 Atherosclerotic heart disease of native coronary artery without angina pectoris: Secondary | ICD-10-CM

## 2010-09-25 DIAGNOSIS — R0789 Other chest pain: Secondary | ICD-10-CM

## 2010-09-25 DIAGNOSIS — R0609 Other forms of dyspnea: Secondary | ICD-10-CM

## 2010-10-01 ENCOUNTER — Ambulatory Visit (INDEPENDENT_AMBULATORY_CARE_PROVIDER_SITE_OTHER): Admitting: Internal Medicine

## 2010-10-01 ENCOUNTER — Encounter: Payer: Self-pay | Admitting: Internal Medicine

## 2010-10-01 DIAGNOSIS — I2510 Atherosclerotic heart disease of native coronary artery without angina pectoris: Secondary | ICD-10-CM

## 2010-10-01 DIAGNOSIS — I10 Essential (primary) hypertension: Secondary | ICD-10-CM

## 2010-10-02 NOTE — Progress Notes (Signed)
Summary: Nuc Pre-Procedure  Phone Note Call from Patient   Caller: Patient Action Taken: Phone Call Completed Summary of Call: Reviewed information on Myoview Information Sheet (see scanned document for further details).  Spoke with patient on Tues, 09/23/10.     Nuclear Med Background Indications for Stress Test: Evaluation for Ischemia, Stent Patency, Post Hospital  Indications Comments: Seen in [facility] ER for chest pain 09/16/10. Negative enzymes.  History: Echo, Heart Catheterization, Myocardial Perfusion Study, Stents  History Comments: 2009- Normal MPS. EF= 64% 4/11- Echo- EF= 60-65% 10/11- Cath w/Stent-RCA. EF= 60%  Symptoms: Chest Pain    Nuclear Pre-Procedure Cardiac Risk Factors: Carotid Disease, History of Smoking, Hypertension, IDDM Type 2, Lipids Height (in): 68.75

## 2010-10-02 NOTE — Assessment & Plan Note (Addendum)
Summary: Cardiology Nuclear Testing  Nuclear Med Background Indications for Stress Test: Evaluation for Ischemia, Stent Patency, Post Hospital  Indications Comments: Seen in [facility] ER for chest pain 09/16/10. Negative enzymes.  History: Cardioversion, Echo, Heart Catheterization, Myocardial Perfusion Study, Stents  History Comments: 2009- Normal MPS. EF= 64% 4/11- Echo- EF= 60-65% 10/11- Cath w/Stent-RCA. EF= 60% '98 Hx. PAF with cardioversion.  Symptoms: Chest Pain, Chest Pressure, DOE, Fatigue    Nuclear Pre-Procedure Cardiac Risk Factors: Carotid Disease, History of Smoking, Hypertension, IDDM Type 2, Lipids Caffeine/Decaff Intake: None NPO After: 7:00 AM Lungs: Clear IV 0.9% NS with Angio Cath: 22g     IV Site: R Hand IV Started by: Crissie Figures, RN Chest Size (in) 50     Height (in): 68.75 Weight (lb): 230 BMI: 34.34 Tech Comments: Held coreg this am.  Nuclear Med Study 1 or 2 day study:  1 day     Stress Test Type:  Treadmill/Lexiscan Reading MD:  Mertie Moores, MD     Referring MD:  D. Bensimhon Resting Radionuclide:  Technetium 11mTetrofosmin     Resting Radionuclide Dose:  10.5 mCi  Stress Radionuclide:  Technetium 922metrofosmin     Stress Radionuclide Dose:  33.0 mCi   Stress Protocol      Max HR:  107 bpm     Predicted Max HR:  16817pm  Max Systolic BP: 19711m Hg     Percent Max HR:  64.85 %Rate Pressure Product:  20437  Lexiscan: 0.4 mg   Stress Test Technologist:  PaIrven Baltimore RN     Nuclear Technologist:  StCharlton AmorCNMT  Rest Procedure  Myocardial perfusion imaging was performed at rest 45 minutes following the intravenous administration of Technetium 9943mtrofosmin.  Stress Procedure  The patient received IV Lexiscan 0.4 mg over 15-seconds with concurrent low level exercise and then Technetium 45m22mrofosmin was injected at 30-seconds while the patient continued walking one more minute.  There were nonspecific T wave changes with Lexiscan.   Quantitative spect images were obtained after a 45 minute delay.  QPS Raw Data Images:  Normal; no motion artifact; normal heart/lung ratio. Stress Images:  Normal homogeneous uptake in all areas of the myocardium. Rest Images:  Normal homogeneous uptake in all areas of the myocardium. Subtraction (SDS):  No evidence of ischemia. Mild apical thinning.  Transient Ischemic Dilatation:  1.07  (Normal <1.22)  Lung/Heart Ratio:  0.36  (Normal <0.45)  Quantitative Gated Spect Images QGS EDV:  180 ml QGS ESV:  81 ml QGS EF:  55 % QGS cine images:  LV mildly dilated. No wall motion abnormalities.  Findings Normal nuclear study      Overall Impression  Exercise Capacity: Lexiscan with low level exercise Clinical Symptoms: Chest fullness and bilateral calf fatigue ECG Impression: No significant ST segment change with Lexiscan. Overall Impression: Normal stress nuclear study.  Appended Document: Cardiology Nuclear Testing   ok  Appended Document: Cardiology Nuclear Testing left message for pt on his voicemail (OK per wife) of normal results

## 2010-10-02 NOTE — Progress Notes (Signed)
Summary: Nuc Pre-Procedure  Phone Note Outgoing Call Call back at [employer] Phone [phone]   Call placed by: Hubbard Robinson RN,  September 24, 2010 3:30 PM Call placed to: Patient Reason for Call: Confirm/change Appt Summary of Call: Left message with information on Myoview Information Sheet (see scanned document for details).     Nuclear Med Background Indications for Stress Test: Evaluation for Ischemia, Stent Patency, Post Hospital  Indications Comments: Seen in [facility] ER for chest pain 09/16/10. Negative enzymes.  History: Echo, Heart Catheterization, Myocardial Perfusion Study, Stents  History Comments: 2009- Normal MPS. EF= 64% 4/11- Echo- EF= 60-65% 10/11- Cath w/Stent-RCA. EF= 60%  Symptoms: Chest Pain    Nuclear Pre-Procedure Cardiac Risk Factors: Carotid Disease, History of Smoking, Hypertension, IDDM Type 2, Lipids Height (in): 68.75

## 2010-10-08 NOTE — Assessment & Plan Note (Signed)
Summary: eph. per chris b pa.gd  Medications Added PLAVIX 75 MG TABS (CLOPIDOGREL BISULFATE) Take one tablet by mouth daily      Allergies Added:   Visit Type:  Follow-up / eph Primary Provider:  Lang Snow  CC:  pt stated under a lot of stress.  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  He has a history of hypertension, diabetes, hyperlipidemia, OSA, CAD and obesity.   Hs recently set up for cardiac cath due to chest pain.  This demonstrated 70-80% RCA stenosis.  It was decided to treat this with a Promus DES.  Platelet inhibition was low on Plavix and he was placed on Effient + ASA.  He returns for follow up. Since his PCI, he noted a RFA pseudoaneurysm by u/s in his office. This was compressed.  Carotid u/s 10/11: R 40-59% L 0-39%  Recently started on androgel for low testosterone.  Last week seen in ER for chest pressure resolved with NTG. CE negative. Underwent outpatient Lexiscan Myoview for recurent CP on 09/23/10: EF 55%. No ischemia. LV mildly dilated.   He returns for routine f/u. Trying to wean off Lunesta. Feels he is sleeping better and not waking with nightmares as much. More rested. BP typically 130/80. Occasional "warmth" in chest but no angina. Continues with Effient. No bleeding noted.   Having persistent HA - taking 8 tylenol per day. Relates it to starting Effient. Also notes that his BP is extremely salt-sensitive particularly at night.   Current Medications (verified): 1)  Minoxidil 10 Mg Tabs (Minoxidil) .... Two Times A Day 2)  Lotrel 10-40 Mg Caps (Amlodipine Besy-Benazepril Hcl) .... Once Daily 3)  Coreg Cr 40 Mg Xr24h-Cap (Carvedilol Phosphate) .... Once Daily 4)  Clonidine Hcl 0.2 Mg Tabs (Clonidine Hcl) .Marland Kitchen.. 1 Tab in Am, 1 Tab At Noon, and 2 Tabs At Bedtime 5)  Furosemide 40 Mg Tabs (Furosemide) .... Take One Tablet By Mouth Daily. 6)  Klor-Con M20 20 Meq Cr-Tabs  (Potassium Chloride Crys Cr) .... Once Daily ( Two Times A Day As Needed ) 7)  Glucovance 2.5-500 Mg Tabs (Glyburide-Metformin) .... 2 By Mouth Two Times A Day 8)  Novolog Mix 70/30 70-30 % Susp (Insulin Aspart Prot & Aspart) .Marland Kitchen.. 1-2 Times A Day 9)  Aspirin Ec 325 Mg Tbec (Aspirin) .... Take One Tablet By Mouth Daily 10)  Klonopin 0.5 Mg Tabs (Clonazepam) .... Take 1 By Mouth Two Times A Day 11)  Oxygen 2liters .... At Bedtime As Needed 12)  Hyzaar 100-25 Mg Tabs (Losartan Potassium-Hctz) .... Once Daily 13)  Lipitor 20 Mg Tabs (Atorvastatin Calcium) .... Take One Tablet By Mouth Daily. 14)  Super B Complex .Marland Kitchen.. 1 Tab Once Daily 15)  Effient 10 Mg Tabs (Prasugrel Hcl) .... Take One Tablet By Mouth Daily 16)  Androgel Pump 1.25 Gm/act (1%) Gel (Testosterone) .... 4 Pumps Once Daily  Allergies (verified): 1)  ! Morphine 2)  ! Augmentin 3)  ! Procardia 4)  ! Amoxicillin  Past History:  Past Medical History: Last updated: 07/07/2010 CAD   a.  cath 06/14/2010:  LAD 40-50%; CFX 30%; OM2 30%; RCA 70-80% (tx with DES), 50-60% distal   b.  Promus DES to RCA 06/14/2010 [facility] trial)   c.  EF 60% on cath Hx pancreatitis Hyperlipidemia Hypertension Diabetes Hypoventilation during sleep- former navy diver    --nocturnal desaturaion    --no apnea Obesity Dyspnea   --  myoview 10/09: TM/myoview 63% no ischemia/scar Carotid u/s 7/08    --0-39% bilat     --f/u 05/2010: 26-33% RICA; 3-54% LICA Bells Palsy 56/25 [place] Probable PTSD  Review of Systems       As per HPI and past medical history; otherwise all systems negative.   Vital Signs:  Patient profile:   55 year old male Height:      68.75 inches Weight:      226.25 pounds BMI:     33.78 Pulse rate:   72 / minute BP sitting:   146 / 74  (left arm)  Vitals Entered By: Hansel Feinstein CMA (October 01, 2010 9:49 AM)  Physical Exam  General:  Well nourished, well developed, in no acute distress HEENT: normal. mild  residual L facial droop due to Bells Palsy Neck: no JVD Cardiac:  normal S1, S2; RRR; no murmur Lungs:  clear to auscultation bilaterally, no wheezing, rhonchi or rales Abd: soft, nontender, no hepatomegaly Ext: no edema; Right Radial site without hematoma or bruit; R FA site without hematoma or bruit Vascular: no carotid  bruits Skin: warm and dry Neuro:  CNs 2-12 intact, no focal abnormalities noted    Impression & Recommendations:  Problem # 1:  CORONARY ATHEROSCLEROSIS NATIVE CORONARY ARTERY (ICD-414.01) Stable. Recent Myoview normal. Discussed need to reduce emotional stress. Given HA with Effient will switch back to Plavix. We did discuss the fact that his platelet inhibition was low on Plavix and that it may be less effective at presenting late stent thrombosis but that this had not been borne out in the ADAPT-DES resgistry at this point.  Problem # 2:  HYPERTENSION (ICD-401.9) BP overall improved. But very sensititve to stress and salt. Continue current regimen.   Patient Instructions: 1)  Stop Effient 2)  Start Plavix 33m daily 3)  Follow up in 4 months Prescriptions: PLAVIX 75 MG TABS (CLOPIDOGREL BISULFATE) Take one tablet by mouth daily  #30 x 6   Entered by:   HKevan Rosebush RN   Authorized by:   DJolaine Artist MD, [facility]  Signed by:   HKevan Rosebush RN on 10/01/2010   Method used:   Electronically to        [id number] (retail)       [address] [place]  [id number]      Ph: [phone]      Fax: [phone]  RxID:   [id number]

## 2010-10-29 LAB — BASIC METABOLIC PANEL WITH GFR
BUN: 10 mg/dL (ref 6–23)
CO2: 31 meq/L (ref 19–32)
Calcium: 9 mg/dL (ref 8.4–10.5)
Chloride: 103 meq/L (ref 96–112)
Creatinine, Ser: 0.69 mg/dL (ref 0.4–1.5)
GFR calc non Af Amer: >60 mL/min
Glucose, Bld: 160 mg/dL — ABNORMAL HIGH (ref 70–99)
Potassium: 3.7 meq/L (ref 3.5–5.1)
Sodium: 139 meq/L (ref 135–145)

## 2010-10-29 LAB — CBC
HCT: 37.7 % — ABNORMAL LOW (ref 39.0–52.0)
HCT: 38.1 % — ABNORMAL LOW (ref 39.0–52.0)
Hemoglobin: 12.6 g/dL — ABNORMAL LOW (ref 13.0–17.0)
Hemoglobin: 12.7 g/dL — ABNORMAL LOW (ref 13.0–17.0)
MCH: 26.5 pg (ref 26.0–34.0)
MCH: 27.3 pg (ref 26.0–34.0)
MCHC: 33.1 g/dL (ref 30.0–36.0)
MCHC: 33.7 g/dL (ref 30.0–36.0)
MCV: 80.2 fL (ref 78.0–100.0)
MCV: 81.1 fL (ref 78.0–100.0)
Platelets: 214 K/uL (ref 150–400)
Platelets: 222 K/uL (ref 150–400)
RBC: 4.65 MIL/uL (ref 4.22–5.81)
RBC: 4.75 MIL/uL (ref 4.22–5.81)
RDW: 13.7 % (ref 11.5–15.5)
RDW: 13.8 % (ref 11.5–15.5)
WBC: 6.6 K/uL (ref 4.0–10.5)
WBC: 8.7 K/uL (ref 4.0–10.5)

## 2010-10-29 LAB — PLATELET INHIBITION P2Y12: Platelet Function Baseline: 344 [PRU] (ref 194–418)

## 2010-10-29 LAB — GLUCOSE, CAPILLARY
Glucose-Capillary: 107 mg/dL — ABNORMAL HIGH (ref 70–99)
Glucose-Capillary: 169 mg/dL — ABNORMAL HIGH (ref 70–99)

## 2010-10-29 LAB — CARDIAC PANEL(CRET KIN+CKTOT+MB+TROPI)
CK, MB: 3.9 ng/mL (ref 0.3–4.0)
CK, MB: 4.4 ng/mL — ABNORMAL HIGH (ref 0.3–4.0)
Relative Index: 2.8 — ABNORMAL HIGH (ref 0.0–2.5)
Total CK: 159 U/L (ref 7–232)
Total CK: 160 U/L (ref 7–232)

## 2010-10-29 LAB — BASIC METABOLIC PANEL
CO2: 30 mEq/L (ref 19–32)
Chloride: 107 mEq/L (ref 96–112)
Creatinine, Ser: 0.7 mg/dL (ref 0.4–1.5)
GFR calc Af Amer: >60 mL/min (ref >60)
Potassium: 3.3 mEq/L — ABNORMAL LOW (ref 3.5–5.1)

## 2010-10-29 LAB — PROTIME-INR
INR: 1 (ref 0.00–1.49)
Prothrombin Time: 13.4 s (ref 11.6–15.2)

## 2010-10-29 LAB — APTT: aPTT: 34 s (ref 24–37)

## 2010-12-17 ENCOUNTER — Ambulatory Visit: Admitting: Internal Medicine

## 2010-12-30 ENCOUNTER — Other Ambulatory Visit: Payer: Self-pay | Admitting: Internal Medicine

## 2010-12-30 NOTE — Assessment & Plan Note (Signed)
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   Overall, he is doing okay.  He has been having significant problems with  his daughter and that is causing a lot of stress in his life.  His blood  pressures unfortunately have been starting to climb back up with  systolics in the 557-322 range.  He has been taking his medications.  Unfortunately, he has not been exercising as much as he had in the past.  He notes that when he does strenuous exercise, he does get some dyspnea,  but no chest pain.   CURRENT MEDICATIONS:  1. NovoLog insulin.  2. Clonidine 0.2 t.i.d.  3. Simcor 500/20 mg 2 tablets at night.  4. Potassium 20 b.i.d.  5. Hyzaar 100/25.  6. Coreg CR 40 mg a day.  7. Minoxidil 10 a day.  8. Lunesta.  9. Glucovance 2.5/500, two tablets b.i.d.  10.Aspirin 81 a day.  11.Lotrel 10/40.  12.Klonopin 0.5 mg half tablet b.i.d.  13.Lasix 40 a day.  14.Fish oil.   PHYSICAL EXAMINATION:  GENERAL:  He is well appearing in no acute  distress.  He ambulates around the clinic with no respiratory  difficulty.  VITAL SIGNS:  Blood pressure is 172/94, heart rate 75, and weight is  224.  HEENT:  Normal.  NECK:  Supple.  No JVD.  Carotids are 2+ bilaterally without bruits.  There is no lymphadenopathy or thyromegaly.  CARDIAC:  PMI is nondisplaced.  Regular rate and rhythm without S4, no  murmur.  LUNGS:  Clear.  ABDOMEN:  Obese, nontender, and nondistended.  No hepatosplenomegaly, no  bruits, no masses appreciated.  Good bowel sounds.  EXTREMITIES:   Warm with no cyanosis, clubbing, or edema.  No rash.  NEURO:  Alert and oriented x3.  Cranial nerves II through XII are  intact.  Moves all 4 extremities without difficulty.  Affect is  pleasant.   EKG shows sinus rhythm at a rate of 75.  No ST-T wave abnormalities.   ASSESSMENT/PLAN:  1. Hypertension.  Unfortunately his blood pressure is starting to      drift back up.  We will increase his minoxidil to 20 mg a day.  We      previously did a CT in 2007, which showed no evidence of renal      artery stenosis.  We will see him back in 6 weeks to see how he is      doing, reinforced the need for salt restriction and weight loss.  2. Dyspnea on exertion.  Given his risk factors, I think it is      reasonable to proceed with stress testing.  We will  get a stress      Myoview.   DISPOSITION:  I will see him back in clinic in a couple months to follow  up.     Shaune Pascal. Bensimhon, MD  Electronically Signed    DRB/MedQ  DD: 05/03/2008  DT: 05/04/2008  Job #: [id number]   cc:   Janalyn Rouse, M.D.

## 2010-12-30 NOTE — Assessment & Plan Note (Signed)
[place] OFFICE NOTE   EBB, CARELOCK                     MRN:          [id number]  DATE:02/04/2007                            DOB:          [birth year]    PRIMARY CARE PHYSICIAN:  Dr. Lang Snow.   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  He returns today for routine followup.   He is doing fairly well.  He denies any chest pain or significant  shortness of breath.  He does note that this past week when he was out  working in his yard and trying to start the gas trimmer with ear plugs  in, he heard a bruit in his left ear.  He was concerned that he may have  a blockage.  Otherwise, doing well.  He has stopped his Byetta because  it is making him sick to his stomach.   CURRENT MEDICATIONS:  1. Hyzaar 100/25.  2. Coreg CR 40 a day.  3. Minoxidil 10 a day.  4. Lunesta 3 mg a day.  5. Glucovance 12/998 b.i.d.  6. Clonidine 0.1 b.i.d.  7. Vytorin 10/20 a day.  8. Folic acid.  9. Aspirin 81 a day.  10.Lotrel 10/40 a day.  11.Klonopin 0.25 b.i.d.  12.Multivitamin.  13.Lasix 40 a day.  14.Potassium 20 a day.  15.Fish oil 3 tablets b.i.d.   PHYSICAL EXAMINATION:  He is well appearing, no acute distress,  ambulates around the clinic without any respiratory difficulty.  Blood pressure is 156/80 with a heart rate of 75, weight is 210.  HEENT:  Normal.  NECK:  Supple.  There is no JVD.  Carotids are 2+ bilaterally without  any bruits.  There is no lymphadenopathy or thyromegaly.  CARDIAC:  PMI is nondisplaced.  He has a regular rate and rhythm with a  2/6 systolic ejection murmur at the right sternal border.  S2 is well  preserved.  There is a soft S4.  LUNGS:  Clear.  ABDOMEN:  Soft, nontender, nondistended.  No hepatosplenomegaly, no  bruits, no masses, good bowel sounds.  EXTREMITIES:  Warm with no cyanosis, clubbing, or edema.  No rash.  NEUROLOGIC:  He is alert and oriented x3.  Cranial nerves II-XII are  intact.  Moves all 4 extremities without difficulty.   EKG shows normal sinus rhythm at a rate of 75.   ASSESSMENT AND PLAN:  1. Hypertension:  Blood pressure is elevated today, but he has not yet      taken his afternoon medications.  I asked him to keep a blood      pressure log and e-mail it to me in the next 4-6 weeks.  2. Hyperlipidemia:  LDL is at goal.  HDL is very low at 26.  He has      recently started fish oil.  He has had some questions about      Vytorin, and we made the decision just to switch him over to      simvastatin 40  a day.  He has been intolerant of NIASPAN in the      past.  3. Question audible bruit:  We will check carotid ultrasound.  4. Cardiovascular risk screening:  He has had a previous negative      stress test.  In light of what happened to [employer] recently, he      is curious about cardiac CT.  Will investigate the cost of this for      him.   DISPOSITION:  Return to clinic in several months.     Shaune Pascal. Bensimhon, MD  Electronically Signed    DRB/MedQ  DD: 02/04/2007  DT: 02/04/2007  Job #: [id number]   cc:   Janalyn Rouse, M.D.

## 2010-12-30 NOTE — Assessment & Plan Note (Signed)
[place] OFFICE NOTE   LUKIS, BUNT                     MRN:          [id number]  DATE:10/25/2007                            DOB:          [birth year]    PRIMARY CARE PHYSICIAN:  Janalyn Rouse, M.D.   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  He returns today for routine followup.  Overall, he is doing pretty well. Denies any chest pain.  His blood  pressure is been running in the low 140s and high 130s. He does note  that when he does strenuous activity he gets mildly short of breath.  This is unchanged.   CURRENT MEDICATIONS:  1. Hyzaar 100/25 a day.  2. Coreg CR 40 mg a day.  3. Minoxidil 10 a day.  4. Lunesta  3 mg a day.  5. Glucovance 2. 5/500- 2 tablets b.i.d.  6. Clonidine  0.1 b.i.d.  7. Aspirin 81 a day.  8. Lotrel 10/40 a day.  9. Klonopin 0.5 mg half-tablet b.i.d.  10.Lasix 40 a day.  11.Potassium 20 b.i.d.  12.Simcor 500/20- 2 tablets at night.   PHYSICAL EXAM:  He is well-appearing and in no acute distress.  He  ambulates around the clinic without any respiratory difficulty.  Blood  pressure is 144/82, heart rate 75,  weight is 221.  HEENT is normal.  NECK:  Supple.  No JVD.  Carotid 2+ bilaterally without bruits.  There  is no lymphadenopathy or thyromegaly.  CARDIAC:  PMI is nondisplaced.  Regular rate and rhythm with soft systolic ejection murmur at left  sternal border. There is an S4.  LUNGS:  Clear.  ABDOMEN:  Obese, nontender, nondistended, no hepatosplenomegaly, no  bruits, no masses.  Good bowel sounds.  EXTREMITIES: Warm.  No cyanosis, clubbing or edema.  No rash.  NEURO:  Alert and oriented x3.  Cranial nerves II-XII intact.  Moves all  four extremities without difficulty.  Affect is pleasant.   EKG shows normal sinus rhythm at a rate of 75;  no ST-T wave changes.   ASSESSMENT/PLAN:  1. Hypertension.  Blood pressure remains elevated, increased clonidine      0.2 b.i.d.  2. Hyperlipidemia. This is followed by Dr. Brigitte Pulse continue Simcor.  He      seems to be tolerating it nicely.  3. Exertional dyspnea.  This is a mild. Previous stress test about 3      years ago showed an ejection fraction of 51% with question of mild      ischemia at the apex.  We treated this medically.  We will consider      a stress test at his next visit.   DISPOSITION:  Return to clinic in 6 months.     Shaune Pascal. Bensimhon, MD  Electronically Signed    DRB/MedQ  DD: 10/25/2007  DT: 10/26/2007  Job #: [id number]   cc:   Janalyn Rouse, M.D.

## 2010-12-30 NOTE — Assessment & Plan Note (Signed)
Austin [name]   TREW, SUNDE                     MRN:          [id number]  DATE:06/15/2007                            DOB:          Mar 19, 1956    PRIMARY CARE PHYSICIAN:  Dr. Lang Snow.   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  He returns today for routine followup.   He is doing well.  His main complaint is that his dog died last week and  both him and his family have been fairly upset by this.  He denies any  chest pain or shortness of breath.  He has not had any lower extremity  edema.  He has been compliant with his medications.  He previously  stopped his Byetta but is now coming back on it at a lower dose.   CURRENT MEDICATIONS:  1. Hyzaar 100/25.  2. Coreg CR 40 a day.  3. Minoxidil 10 a day.  4. Lunesta 3 a day.  5. Glucovance 2.5/500, two tablets b.i.d.  6. Clonidine 0.1 b.i.d.  7. Aspirin 81.  8. Lotrel 10/40.  9. Klonopin 0.5 mg, 1/2 tablet b.i.d.  10.Lasix 40 a day.  11.Potassium 20 a day.  12.Simvastatin 40 a day.  13.Byetta.   PHYSICAL EXAM:  He is well-appearing, no acute distress.  He ambulates  around the clinic without any respiratory difficulty.  Blood pressure is  118/72, heart rate 73, weight 221.  HEENT:  Normal.  NECK:  Supple.  There is no JVD.  Carotids are 2+ bilaterally without  any bruits.  There is no lymphadenopathy or thyromegaly.  CARDIAC:  Regular rate and rhythm with a soft systolic ejection murmur  at the right sternal border.  S2 was well preserved.  There was a soft  S4.  LUNGS:  Clear.  ABDOMEN:  Obese, nontender, nondistended, no hepatosplenomegaly, no  bruits, no masses, good bowel sounds.  EXTREMITIES:  Warm with no cyanosis, clubbing or edema, no rash.  NEURO:  He is alert and oriented x3.  Cranial nerves II through XII  are  intact.  He moves all 4 extremities without difficulty.  EKG shows normal sinus rhythm at a rate of  73, no significant ST-T wave  abnormalities.   ASSESSMENT AND PLAN:  1. Hypertension, well controlled.  2. Hyperlipidemia.  LDL is at goal at 48 but HDL is very low at 24.      He is unable to tolerate Niaspan.  We have discussed the need for      better blood sugar control, exercise, continuing his Fish Oil.  3. Obesity.  His weight is up a little bit again today, once again we      discussed the need for more frequent exercise.  4. Diabetes.  As per Dr. Brigitte Pulse.   DISPOSITION:  Will see him back in 4 months for routine followup.  Will  recheck his cholesterol at that time.     Shaune Pascal. Bensimhon, MD  Electronically Signed  DRB/MedQ  DD: 06/15/2007  DT: 06/16/2007  Job #: [id number]   cc:   Austin [name], M.D.

## 2010-12-30 NOTE — Assessment & Plan Note (Signed)
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  He has a history of  hypertension, diabetes, hyperlipidemia, and obesity.   Overall, he is doing quite well.  He states his blood pressure systolics  have been running in the 120-130 range.  He has not had any chest pain  or shortness of breath.  He is in very good about taking his medicines.   He does note that about twice a month, he will wake up at night with  palpitations.  He will take a clonidine and will be able to go back to  bed.  This has not happened during the day.  He states his heart rate  usually in the 90s and it is irregular.   CURRENT MEDICATIONS:  1. Hyzaar 100/25.  2. Coreg CR 40 a day.  3. Lunesta.  4. Glucovance 2.5/500 two tablets b.i.d.  5. Aspirin 81.  6. Lotrel 10/40 a day.  7. Lasix 40 a day.  8. Potassium 20 b.i.d.  9. Symbicort 500/20 two tablets nightly.  10.Clonidine 0.2 t.i.d.  11.Minoxidil 10 b.i.d.  12.Klonopin.  13.NovoLog insulin.  14.Allegra 180 a day.   PHYSICAL EXAMINATION:  GENERAL:  He is a well appearing in no acute  distress.  Ambulates around the clinic without any respiratory  difficulty.  VITAL SIGNS:  Blood pressure is 146/75, heart rate is 87, and weight is  229.  HEENT:  Normal.  NECK:  Supple.  No obvious JVD.  Carotids are 2+ bilaterally without any  bruits.  There is no lymphadenopathy or thyromegaly.  CARDIAC:  PMI is  nondisplaced.  Regular rate and rhythm with S4.  No murmur.  LUNGS:  Clear.  ABDOMEN:  Obese, nontender, and nondistended.  No obvious  hepatosplenomegaly.  No bruits.  No masses.  EXTREMITIES:  Warm with no cyanosis, clubbing, or edema.  No  rash.  NEURO:  Alert and oriented x3.  Cranial nerves II-XII are intact.  Moves  all 4 extremities without difficulty.  Affect is pleasant.   ASSESSMENT AND PLAN:  1. Hypertension.  Blood pressures is little bit elevated here, but      seems like it is well controlled at home.  Continue current      therapy.  2. Hyperlipidemia.  Followed by Dr. [name].  Goal LDL is less      than 70.  3. Nighttime palpitations.  My sense is that this is probably sleep      apnea or possibly an arrhythmia.  Less likely, I think it could be      pheochromocytoma.  He has had 2 sleep studies in the past, one in      10/01/2003 was positive.  However, he states the one more recently was      negative.  I  have asked him to fax me the report, so I can review.      At this point what we will do is will place an event monitor on him      and see if we can pickup any arrhythmias.  We may also have to      consider 24-hour urine down the road.   DISPOSITION:  I will see him back in a few months for routine followup.     Shaune Pascal. Bensimhon, MD  Electronically Signed    DRB/MedQ  DD: 08/07/2008  DT: 08/08/2008  Job #: [id number]

## 2011-01-02 NOTE — Op Note (Signed)
Austin [name], STIGGER NO.:  [id number]   MEDICAL RECORD NO.:  [id number]          PATIENT TYPE:  OIB   LOCATION:  3172                         FACILITY:  [facility]   PHYSICIAN:  Faythe Ghee, M.D. DATE OF BIRTH:  24-Aug-1955   DATE OF PROCEDURE:  08/12/2006  DATE OF DISCHARGE:  08/12/2006                               OPERATIVE REPORT   PREOPERATIVE DIAGNOSES:  Herniated disk C6-7 right.   POSTOPERATIVE DIAGNOSES:  Herniated disk C6-7 right.   PROCEDURE:  C6-7 anterior cervical diskectomy with bone bank fusion  followed by Mystique anterior cervical plating.   SURGEON:  Faythe Ghee, M.D.   ASSISTANT:  Kary Kos, M.D.   PROCEDURE IN DETAIL:  After placement in the supine position in 10  pounds halter traction, the patient's neck was prepped and draped in  usual sterile fashion.  Localizing fluoroscopy was used prior to  incision to identify the appropriate level.  Transverse incision was  made in the right anterior neck starting at the midline headed towards  the medial aspect of the sternocleidomastoid muscle.  The platysma  muscle was then incised transversely.  The natural fascial plane between  the strap muscles medially and sternocleidomastoid laterally was  identified and followed down to the anterior aspect of cervical spine.  Longus colli muscles were identified, split in the midline with Kitner  dissector unipolar coagulation.  Self retractor was placed for exposure  and x-rays showed approach to the appropriate level.  Using 15 blade the  disk at C6-7 was incised.  Using pituitary rongeurs and curettes  proximally the disk material was removed.  High-speed drill was used to  remove bony overgrowth and widen the interspace.  The microscope was  draped,  brought into the field and used the remainder of the case.  Using microsection technique the remainder of disk material down to the  posterior longitudinal ligament was removed.  The ligament was  then  incised transversely and the cut edges removed with a Kerrison punch.  Thorough decompression was carried out of the spinal dura.  Decompression was then carried out on the right side to decompress the  underlying C-7 nerve root and bony overgrowth and hernia disk material  were removed and gave excellent decompression of the nerve root as well  as excellent visualization.  Similar decompression was then carried out  towards the left asymptomatic side.  At this point inspection was  carried out in all directions without any evidence of residual  compression identified.  Large amounts of irrigation were carried out.  Any bleeding controlled with bipolar coagulation and Gelfoam.  Measurements were taken and a 9 mm bone bank plug was reconstituted.  After irrigating once more and confirming hemostasis the plugs were  packed without difficulty and fluoroscopy showed it to be in good  position.  An appropriate length Mystique anterior cervical plate was  then chosen.  Under fluoroscopic guidance drill holes were placed  followed by tapping and placing of 13 mm screws x4.  Final fluoroscopy  was slightly difficult to interpret due to the patient's large body  habitus  but the plate, screws and plug all appeared to be in good  position.  At this time large amounts of irrigation were carried out.  Any bleeding controlled with bipolar coagulation.  The wound  was then closed with interrupted Vicryl on the platysma muscle, inverted  5-0 PDS on the subcuticular layer and Steri-Strips on the skin.  A  sterile dressings and soft collar were applied.  The patient was  extubated, taken to recovery room in stable condition.           ______________________________  Faythe Ghee, M.D.     ROK/MEDQ  D:  08/12/2006  T:  08/12/2006  Job:  [id number]

## 2011-01-02 NOTE — Assessment & Plan Note (Signed)
[facility]                               PULMONARY OFFICE NOTE   Austin [name], Austin [name]                     MRN:          [id number]  DATE:07/07/2006                            DOB:          01-Nov-1955    PULMONARY/SLEEP CONSULTATION:   PROBLEM:  A 55 year old man referred through the courtesy of Dr. Brigitte Pulse  concerned about nocturnal hypoxemia.   HISTORY:  He had a sleep study in October of 2005 while being evaluated for  hypertension.  I have the physician's assessment from that study describing  mild to moderate obstructive apnea with an index of 18 per hour and  desaturation nadir of 75%. He did not qualify for CPAP split titration  protocol on that study night.  Snoring was described as moderate to loud;  events were not positional but were worse during REM sleep. He had a lot of  leg movement. He describes that as an extremely uncomfortable study.  Apparently it was done in the center while still at [facility] but  during the beginning of the renovation process when there was construction  noise, before the center was moved. He had a more recent study on May 10, 2006 at [place].  Epworth sleepiness score was 14/24. On this  study night he says he slept well. He had a normal apnea hypopnea index of  1.3 per hour, desaturating to a nadir of 86% with a total of 8.4 minutes  spent with oxygen saturation of 86% or up to 88%. This was cumulative time.  The technician added 2 liters per minute at 2225. I do not have the graphic  information from that study and it is difficult to know what impact the  supplemental oxygen made. Interpretation recognized the absence of  significant central or obstructive apnea but was unsure about the oxygen  saturation and suggested further evaluation.  He says he never saw a  physician related to the sleep study but a home care company called him to  bring out home oxygen. He was resistant and  did not accept home oxygen.  He  tells me there has been no significant history of lung disease. Bedtime is  around 10 p.m., estimating sleep latency less than 10 minutes and waking two  to three times during the night before final waking at 5:30 to 6 a.m. During  the day any daytime drowsiness while quiet he attributes to Clonidine.  He  is not using CPAP.   MEDICATIONS:  Hydralazine 25 mg b.i.d., Minoxidil 10 mg, Lunesta 3 mg,  Glucovance 2.5/500 mg t.i.d., insulin, Clonidine 0.2 mg t.i.d., Avalide  300/25, Vytorin 10/20, flax seed oil, Folic acid, aspirin 81 mg, Lotrel  10/40, Klonopin 0.5 mg b.i.d., Lasix 40 mg, potassium 20 mEq 3 days a week,  Vieta 10 mg b.i.d.   ALLERGIES:  Drug intolerance to: AUGMENTIN AND PROCARDIA.   REVIEW OF SYSTEMS:  Snoring.  He uses a sleeping pill. Gets drowsy in the  evening, attributed to blood pressure medicines. Has dieted weight down  about 11 pounds in the last few months.  No headache, confusion, nasal  congestion, cough or wheeze, palpitations or chest pain.   PAST HISTORY:  1. No history of asthma.  2. Insulin-dependent diabetes.  3. Seasonal allergic rhinitis.  4. Degenerative disk disease; cervical spine fusion by Dr. Hal Neer is      pending.  5. Echocardiogram he said showed an ejection fraction of 55-65%.  6. No history of thyroid disease.  7. He had necrotizing pancreatitis in 2004 attributed to elevated blood      lipids.   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 He had smoked 1-1/2 packs per day for 20 years, quitting in 1997.  Occasional social alcohol. Married with children. 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   FAMILY HISTORY:  A brother is a heavy smoker with emphysema. Others with  seasonal allergies. Nobody known to have sleep apnea.   OBJECTIVE:  VITAL SIGNS: Weight 224 pounds. Blood pressure 102/62, pulse  regular, 80. Room air  saturation 97%.  GENERAL: This is a somewhat overweight man who is alert and appears  comfortable.  SKIN:  No rash.  ADENOPATHY: None at the neck or shoulders.  HEENT: Dry nose, no post nasal drainage. Palate length 3-4/4, voice quality  normal, no stridor, no erythema.  CHEST: Quiet, clear, unlabored breath sounds.  HEART: Regular without murmur, rub, or gallop.  EXTREMITIES: No clubbing, cyanosis or edema.   LABORATORY DATA:  He reports chest x-ray at the office of Dr. Brigitte Pulse and  echocardiogram by Dr. Haroldine Laws during evaluation for hypertension. The  patient did not tell me about some evidence of ischemia and possible  transient ischemic attacks described in the assessment by Dr. Haroldine Laws on  June 22, 2005 after an abnormal stress-test.   IMPRESSION:  1. His primary concern is with nocturnal hypoxemia on two sleep studies      and on an overnight oximetry which was apparently ordered through Dr.  Edwena Felty office. The sleep study data is non specific and does not suggest  a sustained problem other than prior questioning of mild sleep apnea. We  need to get the oximetry report from Dr. Edwena Felty office to assess need  for night time oxygen supplementation.  1. History of heavy cigarette use offers possibility of COPD and we will      want a PFT.  2. Hypertension.  3. Diabetes.  4. Mild seasonal rhinitis by history.   PLAN:  1. Overnight oximetry report is requested from Dr. Edwena Felty office,      record release obtained.  2. Scheduling pulmonary function test.  3. Schedule to return in 1 months, earlier p.r.n.   I appreciate the chance to meet him.     Clinton D. Annamaria Boots, MD, [place], [place]  Electronically Signed    CDY/MedQ  DD: 07/07/2006  DT: 07/07/2006  Job #: [id number]   cc:   Janalyn Rouse, M.D.  Shaune Pascal. Bensimhon, MD

## 2011-01-02 NOTE — Assessment & Plan Note (Signed)
[facility]                             PULMONARY OFFICE NOTE   GAELEN, BRAGER                     MRN:          [id number]  DATE:08/20/2006                            DOB:          23-Oct-1955    PROBLEM LIST:  1. Nocturnal hypoxemia.  2. Insulin dependent diabetes.  3. Seasonal allergic rhinitis.   HISTORY:  This former smoker had originally come on referral from Dr.  Brigitte Pulse after a question about his oxygen saturations at night. A sleep  study had shown transient desaturations. Without conversation from Dr.  Brett Fairy who was handling the study, the home care company showed up  wanting to give him nocturnal oxygen and he refused, asking further  assessment. Details were outlined in the previous note. He is now status  post anterior cervical spine fusion and is very uncomfortable because of  pain and bulk with a full feeling in his throat. He expects this to  clear aneurysm he has a followup visit with his neurosurgeon soon. We  reviewed the oxygen saturations from his sleep study. Unfortunately, we  were not able to get a report of an overnight oximetry he thought had  been done. He blames allergy for causing his eyes to water recently and  has artificial tears and Pat[place] but is not having chest tightness or  wheeze. He has postoperative discomforts that are keeping him awake but  otherwise does not notice respiratory problems at night.   MEDICATIONS:  1. Hydralazine 25 mg b.i.d.  2. Minoxidil 10 mg.  3. Lunesta 3 mg.  4. Glucovance 2.5/500 t.i.d. insulin.  5. Clonidine 0.2 mg t.i.d.  6. Avalide 300/25 mg.  7. Vytorin 51/88 mg.  8. Folic acid.  9. Aspirin 81 mg.  10.Lotrel 10/40.  11.Klonopin 0.5 mg b.i.d.  12.Lasix 40 mg.  13.Potassium 20 mEq Monday through Friday.  14.Byetta.  15.He is now taking some Tylox.   DRUG INTOLERANCE:  AUGMENTIN and PROCARDIA.   OBJECTIVE:  VITAL SIGNS:  Weight 220 pounds, blood pressure 158/80,  pulse regular 73, room air saturation 97%.  GENERAL:  He sits stiffly. There is a dressing on the right anterior  neck.  LUNGS:  Fields are very clear. Speech quality is good, there is no  stridor.  HEART:  Sounds are normal.   PFT:  Pulmonary functions done December 15, immediately before surgery,  were normal except for mild reversible obstruction and small airway  flows suggesting an asthmatic physiology and small airways. I discussed  this with him.   IMPRESSION:  1. Minimal nocturnal oxygen desaturation not sufficient to require      nocturnal oxygen.  2. Mild asthma defined by reversible small airway flows and not      actively symptomatic.  3. Seasonal allergic rhinitis and conjunctivitis.   PLAN:  1. Artificial tears for his eyes.  2. Scheduled return in 4 months which will be spring time. We can      assess his asthma and rhinitis control then. That will also allow      time for postoperative swelling to go down in his  neck and we can      make a decision about long-term needs or indications for recheck of      his oxygen saturation at that time. He understands that sleep apnea      and sleep associated breathing problems are going to be more severe      transiently due to the fullness in the neck. The family needs to      help [place] watch for progression or significant change.     Clinton D. Annamaria Boots, MD, [place] [facility], [place]  Electronically Signed    CDY/MedQ  DD: 08/20/2006  DT: 08/20/2006  Job #: [id number]   cc:   Janalyn Rouse, M.D.  Shaune Pascal. Bensimhon, MD  Faythe Ghee, M.D.

## 2011-01-02 NOTE — Assessment & Plan Note (Signed)
[facility] OFFICE NOTE   Austin [name], Austin [name]                     MRN:          [id number]  DATE:10/08/2006                            DOB:          1956/04/29    PRIMARY CARE PHYSICIAN:  Dr. Janalyn Rouse, M.D.   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  He returns today for routine followup.  He recently underwent  some cervical spine surgery for numbness and tingling in his right arm.  This has helped somewhat but does not have complete relief.  He also  underwent a sleep study which showed just minimal nocturnal oxygen  desaturation.  Otherwise, he is doing quite well.  He denies any chest  pain or shortness of breath.  His weight continues to come down and his  blood pressure has been better controlled.   CURRENT MEDICATIONS:  1. Hyzaar 100/25.  2. Coreg CR 40 a day.  3. Minoxidil 10 a day.  4. Lunesta 3 mg a day.  5. Glucovance 12/998 b.i.d.  6. Clonidine 0.1 b.i.d.  7. Vytorin 10/20.  8. Flax seed oil.  9. Aspirin 81.  10.Lotrel 10/40.  11.Klonopin.  12.Lasix 40 a day.  13.Potassium 20 a day.  14.Byetta.   PHYSICAL EXAMINATION:  GENERAL:  He is well-appearing in no acute  distress.  Ambulates around the clinic without any respiratory  difficulty.  Blood pressure is 124/74, the heart rate is 76, his weight  is 215.  HEENT:  Sclerae anicteric, EOMI, no xanthelasmas.  Oropharynx is clear.  Mucous membranes are moist.  NECK:  Supple.  No JVD.  Carotids are 2+ bilaterally without any bruits.  There is no lymphadenopathy or thyromegaly.  He does have a well-healed  surgical scar.  CARDIAC:  Regular rate and rhythm, soft S4, no murmur.  LUNGS:  Clear.  ABDOMEN:  Soft, nontender, nondistended.  No hepatosplenomegaly, no  bruits, no masses appreciated, good bowel sounds.  EXTREMITIES:  Warm with no cyanosis, clubbing or edema.  NEUROLOGIC:  Alert and oriented x3.  Cranial nerves II-XII are intact.  Moves all four extremities without difficulty.   ASSESSMENT AND PLAN:  1. Hypertension.  This is well controlled.  Continue current regimen.  2. Hyperlipidemia.  He has a very low HDL at 26.  His LDL is well      controlled.  I suggested he start some fish oil and an exercise      program.  We will recheck in 3 months.  If his HDL is not coming      up, would consider Lopid.  He has previously been intolerant of      Niaspan.     Austin Pascal. Bensimhon, MD  Electronically Signed    DRB/MedQ  DD: 10/08/2006  DT: 10/08/2006  Job #: [id number]   cc:   Janalyn Rouse, M.D.

## 2011-01-02 NOTE — Assessment & Plan Note (Signed)
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   PROBLEM LIST:  1. Hypertension, resistant.      a.     Abdominal CT negative for renal artery stenosis.  2. Normal stress test done for exertional dyspnea.  Adenosine Myoview in      2006, ejection fraction of 51%.  Question mild apical ischemia versus      attenuation.  3. Obesity.  4. Lower extremity edema.  5. Probable sleep apnea pending repeat sleep study this week.  6. Hyperlipidemia.  7. Diabetes.  Hemoglobin A1C 7.1.  8. History of severe necrotizing pancreatitis.  9. Severe cervical disk disease.   CURRENT MEDICATIONS:  1. Hydralazine 50 mg b.i.d.  2. Minoxidil 10 mg a day.  3. Lunesta 3 mg a day.  4. Glucovance 2.5/500 t.i.d.  5. NovoLog 70/30, 10 units t.i.d.  6. Clonidine 0.2 mg b.i.d.  7. Avalide 300/25 a day.  8. Vytorin 10/20.  9. Flax seed oil.  10.Folic acid.  11.Aspirin 81.  12.Lotrel 5/20.  13.Klonopin 0.5 b.i.d.  14.Lasix 20 a day.  15.Potassium 20 mEq Monday, Wednesday and Friday.  16.Coreg CR 10 mg a day.  17.__________ 10 mcg b.i.d.   ALLERGIES:  1. AUGMENTIN.  [place].   INTERVAL HISTORY:  Austin [name] returns today for routine followup.  His main  complaint continues to be right arm paresthesias.  He has been seen by Dr.  Hal Neer in neurosurgery and is pending a myelogram.  He denies any chest  pain or shortness of breath.  His blood pressure has been relatively well  controlled with systolics in the 937-169 range.   PHYSICAL EXAMINATION:  GENERAL:  Well appearing, in no acute distress.  VITAL SIGNS:  Blood pressure is 134/75, heart rate is 81, weight is 230.  HEENT:  Sclerae are anicteric.  Extraocular movements intact.  There are no  xanthelasmas.  Mucous membranes are moist.  NECK:  Neck veins are flat.  No carotid bruits.  No lymphadenopathy or  thyromegaly.  CARDIAC:  Regular rate and rhythm with a soft apical murmur and soft S4.  LUNGS:  Clear.  ABDOMEN:  Obese, nontender, nondistended.  No hepatosplenomegaly, no bruits,  no bruits, no masses.  EXTREMITIES:  Warm with 1+ edema.  No cyanosis or clubbing.  NEUROLOGIC:  Alert and oriented x3.  Cranial nerves II-XII are intact.  Moves all 4 extremities without difficulty.   ASSESSMENT AND PLAN:  1. Hypertension.  Blood pressure has borderline control.  Increase Coreg      to 20 mg a day.  2. Hyperlipidemia.  LDL is at goal.  HDL does remain a bit low, and we      have considered adding Niaspan.  Will re-discuss this at the next  visit.   DISPOSITION:  Return to clinic in 3 months.                                Shaune Pascal. Bensimhon, MD    DRB/MedQ  DD:  05/07/2006  DT:  05/10/2006  Job #:  [id number]   cc:   Lang Snow, M.D.

## 2011-01-02 NOTE — Assessment & Plan Note (Signed)
[facility] OFFICE NOTE   Austin [name], Austin [name]                     MRN:          [id number]  DATE:08/06/2006                            DOB:          January 31, 1956    PRIMARY CARE PHYSICIAN:  Janalyn Rouse, M.D.   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   PROBLEM LIST:  1. Hypertension, resistant.      a.     Abdominal CT negative for renal artery stenosis.  2. Exertional dyspnea.      a.     Adenosine Myoview 2006 EF of 51%, question mild apical       ischemia versus attenuation.  Refusing catheterization.  3. Obesity.  4. Lower extremity edema.  5. Hyperlipidemia.  6. Diabetes.  7. History of severe necrotizing pancreatitis.  8. Severe cervical disk disease, pending surgery next week.  9. Possible mild sleep apnea by sleep study.  Followed by Dr. Baird Lyons.   CURRENT MEDICATIONS:  1. Hydralazine 25 b.i.d.  2. Minoxidil 10 a day.  3. Lunesta 3 mg a day.  4. Glucovance 2.5/500 t.i.d.  5. Novolog 70/30 six units b.i.d.  6. Clonidine 0.2 mg t.i.d.  7. Avalide 300/25.  8. Vytorin 10/20.  9. Flax seed oil.  10.Folic acid.  11.Aspirin 81.  12.Lotrel 10/40.  13.Klonopin 0.5 mg b.i.d.  14.Multivitamin.  15.Lasix 40 a day.  16.Potassium 20 mEq daily.  17.Coreg CR 40 mg a day.  18.Byetta.   ALLERGIES:  AUGMENTIN and PROCARDIA.   INTERVAL HISTORY:  Austin [name] returns today for routine followup.  He is  doing quite well, he notices that his blood pressure is much better  controlled.  However, sometimes in the early morning he does feel like  his blood pressure is up due to pulsations in his head, but he just has  not taken blood pressures at those times.  He has not had any chest pain  or significant dyspnea.  His lower extremity edema is essentially  resolved.  He has lost about 12 pounds of weight after  starting Byetta.  Unfortunately, though, he has given up on his exercise program.   PHYSICAL EXAMINATION:  He is well-appearing, in no acute distress.  Blood pressure is 108/65, heart rate is 76, weight is 220.  HEENT:  Sclerae are anicteric, EOMI.  There is no xanthelasma.  Mucous  membranes are moist.  NECK:  Supple, no JVD.  Carotids are 2+ bilaterally without any bruits.  There is no lymphadenopathy or thyromegaly.  CARDIAC:  Regular rate and rhythm with a soft apical murmur and a soft  S4.  LUNGS:  Clear.  ABDOMEN:  Obese, nontender, nondistended.  No hepatosplenomegaly, no  bruits, no masses, good bowel sounds.  EXTREMITIES:  Warm with 1+ edema.  No cyanosis or clubbing, no rashes.  NEUROLOGIC:  Alert and oriented x3.  Cranial nerves II-XII are intact.  He moves all 4 extremities without difficulty.   ASSESSMENT AND PLAN:  1. Hypertension.  Blood  pressure is under good control now.  He is      wanting to wean off of the clonidine due to fatigue.  I told him we      can start cutting back, and we will cut back to 0.1 three times a      day.  Should his blood pressure begin to rebound up, we can always      titrate up his Minoxidil, which he is tolerating well.  Also his      Avalide is no longer on his formulary, so I will switch him over to      Hyzaar.  I told him to take this in the evening, as this may help      with morning hypertension.  2. Hyperlipidemia.  LDL is at goal.  This is followed by Dr. Brigitte Pulse.  3. Abnormal Myoview.  This was just minimally abnormal.  We have      discussed repeating it at some point in the future to see how he is      doing.  We will consider that in the New Year.  4. Physical inactivity.  I have once again reminded him of the need to      resume his exercise program.     Shaune Pascal. Bensimhon, MD  Electronically Signed    DRB/MedQ  DD: 08/06/2006  DT: 08/08/2006  Job #: [id number]   cc:   Janalyn Rouse, M.D.

## 2011-01-02 NOTE — Procedures (Signed)
[name], Austin [name] NO.:  [id number]   MEDICAL RECORD NO.:  [id number]          PATIENT TYPE:  OUT   LOCATION:  SLEEP CENTER                 FACILITY:  [facility]   PHYSICIAN:  Danton Sewer, M.D. [facility] DATE OF BIRTH:  Aug 09, 1956   DATE OF STUDY:  05/22/2004                              NOCTURNAL POLYSOMNOGRAM   REFERRING PHYSICIAN:  Dr. Phoebe Sharps.   INDICATION FOR THE STUDY:  Hypersomnia with sleep apnea.  Epworth score is  13.   SLEEP ARCHITECTURE:  The patient had a total sleep time of 325 minutes with  decreased REM and slow wave sleep.  Sleep efficiency was reduced at 75%.  Sleep onset latency was normal and REM latency was mildly prolonged.   IMPRESSION:  1.  Mild to moderate obstructive sleep apnea with a respiratory disturbance      index of 18 events per hour and oxygen desaturation as low as 75%.      Events were not positional but were clearly worse during REM.  The      patient did not meet split night protocol due to most of his events      occurring after 12 a.m.  2.  Moderate to loud snoring noted throughout the study.  3.  No clinically significant cardiac arrhythmias.  4.  Large numbers of periodic leg movements with significant sleep      disruption.  This may simply be related to the patient's obstructive      sleep apnea.  I would recommend treatment of his sleep apnea, and then      consider treatment of his leg jerks if he continues to be symptomatic.      KC/MEDQ  D:  06/16/2004 11:39:58  T:  06/16/2004 12:31:07  Job:  [id number]

## 2011-01-15 ENCOUNTER — Encounter: Payer: Self-pay | Admitting: Internal Medicine

## 2011-01-30 ENCOUNTER — Ambulatory Visit (INDEPENDENT_AMBULATORY_CARE_PROVIDER_SITE_OTHER): Admitting: Internal Medicine

## 2011-01-30 ENCOUNTER — Encounter: Payer: Self-pay | Admitting: Internal Medicine

## 2011-01-30 VITALS — BP 152/78 | HR 70 | Resp 14 | Ht 69.0 in | Wt 228.0 lb

## 2011-01-30 DIAGNOSIS — I2510 Atherosclerotic heart disease of native coronary artery without angina pectoris: Secondary | ICD-10-CM

## 2011-01-30 DIAGNOSIS — I10 Essential (primary) hypertension: Secondary | ICD-10-CM

## 2011-01-30 MED ORDER — CLONIDINE HCL 0.2 MG PO TABS: 0.4000 mg | ORAL_TABLET | Freq: Three times a day (TID) | ORAL | Status: DC

## 2011-01-30 NOTE — Assessment & Plan Note (Signed)
BP remains significantly elevated. Will increase clonidine to 0.4 tid. Refer for evaluation of Simplicty trial (renal artery dennervation trial) .

## 2011-01-30 NOTE — Progress Notes (Signed)
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  He has a history of severe hypertension, diabetes, hyperlipidemia, OSA, CAD and obesity.On androgel for low testosterone.  Had cardiac cath 10/11. This demonstrated 70-80% RCA stenosis.  It was decided to treat this with a Promus DES.  Platelet inhibition was low on Plavix and he was placed on Effient + ASA.  He returns for follow up. Since his PCI, he noted a RFA pseudoaneurysm by u/s in his office. This was compressed.  Carotid u/s 10/11: R 40-59% L 0-39%   Seen in ER for chest pressure in 2/12. CE negative. Underwent outpatient Lexiscan Myoview for recurent CP on 09/23/10: EF 55%. No ischemia. LV mildly dilated.   He returns for routine f/u. Has had one episode of CP which resolved with 1 NTG. Walking routinely without CP. Continues with severe HTN. SBP up to 160 on most nights and has to take extra doses of clonidine to bring it down. No HF symptoms.    ROS: All systems negative except as listed in HPI, PMH and Problem List.  Past Medical History  Diagnosis Date  . CAD (coronary artery disease)     LAD 40-50%; CFX 30%; OM2 30%; RCA 70-80% (tx with DES), 50-60% distal/ Promus DES to RCA 06/05/2010 [facility] trial)/ EF 60% on cath  . Pancreatitis   . Hyperlipidemia   . Hypertension   . Diabetes mellitus   . 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 Obesity   . Dyspnea 05/2008    Myoview; TM/myoview 63% no ischemia/ scar  . Injury to carotid artery, unspecified 02/2007    0-39% bilateral; f/u 05/2010: 47-42% RICA; 5-95% LICA  . Bell's palsy 07/2009  . Insomnia   . PTSD (post-traumatic stress disorder)     Current Outpatient Prescriptions  Medication Sig Dispense Refill  . amLODipine-benazepril (LOTREL) 10-40 MG per capsule Take 1 capsule by mouth daily.        Marland Kitchen aspirin 325 MG EC tablet Take 325 mg by mouth daily.         Marland Kitchen atorvastatin (LIPITOR) 20 MG tablet Take 20 mg by mouth daily.        . B Complex-C (SUPER B COMPLEX PO) Take 1 tablet by mouth daily.        . clonazePAM (KLONOPIN) 0.5 MG tablet Take 0.5 mg by mouth at bedtime as needed.       . cloNIDine (CATAPRES) 0.2 MG tablet 1 tablet in the morning, 1 tablet at noon, 2 tabs at bedtime....able to take an extra tab at bedtime if needed      . clopidogrel (PLAVIX) 75 MG tablet Take 75 mg by mouth daily.        Marland Kitchen COREG CR 40 MG 24 hr capsule TAKE 1 CAPSULE EVERY DAY  30 capsule  8  . furosemide (LASIX) 40 MG tablet Take 40 mg by mouth daily.        Marland Kitchen glyBURIDE-metformin (GLUCOVANCE) 2.5-500 MG per tablet Take 2 tablets by mouth 2 (two) times daily.        . insulin aspart protamine-insulin aspart (NOVOLOG MIX 70/30) (70-30) 100 UNIT/ML injection Sliding scale      . losartan-hydrochlorothiazide (HYZAAR) 50-12.5 MG per tablet TAKE 2 TABLETS BY MOUTH EVERY DAY (100/25 ON BACK ORDER)  60 tablet  7  . minoxidil (LONITEN) 10 MG tablet TAKE 1 TABLET TWICE DAILY  60 tablet  8  . NON FORMULARY OXYGEN 2 LITERS. At bedtime as needed.       . potassium chloride SA (K-DUR,KLOR-CON) 20 MEQ tablet Take 20 mEq by mouth 2 (two) times daily. Able to take an extra if needed      . Testosterone (ANDROGEL PUMP) 1.25 GM/ACT (1%) GEL as directed.          PHYSICAL EXAM: Filed Vitals:   01/30/11 1214  BP: 130/80  Pulse: 70  Resp: 14   General:  Well appearing. No resp difficulty HEENT: normal Neck: supple. JVP flat. Carotids 2+ bilaterally; no bruits. No lymphadenopathy or thryomegaly appreciated. Cor: PMI normal. Regular rate & rhythm. No rubs, gallops or murmurs. Lungs: clear Abdomen: soft, nontender, nondistended. No hepatosplenomegaly. No bruits or masses. Good bowel sounds. Extremities: no cyanosis, clubbing, rash, edema Neuro: alert & orientedx3, cranial nerves grossly intact. Moves all 4 extremities w/o difficulty. Affect pleasant.    ECG: NSR 69. Mild TW  flattening v5-v6   ASSESSMENT & PLAN:

## 2011-01-30 NOTE — Patient Instructions (Signed)
Increase Clonidine to 0.2 mg 2 tabs Three times a day  Your physician wants you to follow-up in: 4 months.  You will receive a reminder letter in the mail two months in advance. If you don't receive a letter, please call our office to schedule the follow-up appointment.

## 2011-01-30 NOTE — Assessment & Plan Note (Addendum)
Relatively stable. Continue aggressive medical therapy. If CP increasing he knows to contact us.

## 2011-03-24 ENCOUNTER — Telehealth: Payer: Self-pay | Admitting: Internal Medicine

## 2011-03-24 NOTE — Telephone Encounter (Signed)
Faxed BMP to Gay Filler at [facility] ([phone]).

## 2011-04-08 ENCOUNTER — Other Ambulatory Visit: Payer: Self-pay | Admitting: *Deleted

## 2011-04-08 MED ORDER — CLOPIDOGREL BISULFATE 75 MG PO TABS: 75.0000 mg | ORAL_TABLET | Freq: Every day | ORAL | Status: DC

## 2011-05-04 ENCOUNTER — Other Ambulatory Visit: Payer: Self-pay | Admitting: Internal Medicine

## 2011-05-09 ENCOUNTER — Other Ambulatory Visit: Payer: Self-pay | Admitting: Internal Medicine

## 2011-05-14 ENCOUNTER — Telehealth: Payer: Self-pay | Admitting: Internal Medicine

## 2011-05-14 NOTE — Telephone Encounter (Signed)
Pt returning your call

## 2011-05-14 NOTE — Telephone Encounter (Signed)
Left message to call back

## 2011-05-14 NOTE — Telephone Encounter (Signed)
Spoke with pt. He was in Simplicity Trial in [place] but failed trial. He is requesting a note from Dr. Haroldine Laws regarding his drug resistant hypertension that he can submit to the [place] so they will reopen his hypertension case.  His email is [email or web address]. I told pt I would forward note to Dr. Haroldine Laws.

## 2011-05-14 NOTE — Telephone Encounter (Signed)
Patient calling need a office note written note by Dr. Haroldine Laws for his drug resistance so he can submitted to [place].

## 2011-07-04 ENCOUNTER — Other Ambulatory Visit: Payer: Self-pay | Admitting: Internal Medicine

## 2011-07-30 ENCOUNTER — Other Ambulatory Visit: Payer: Self-pay | Admitting: *Deleted

## 2011-07-30 MED ORDER — LOSARTAN POTASSIUM-HCTZ 50-12.5 MG PO TABS: 2.0000 | ORAL_TABLET | Freq: Every day | ORAL | Status: DC

## 2011-08-26 ENCOUNTER — Other Ambulatory Visit: Payer: Self-pay | Admitting: Internal Medicine

## 2011-08-27 ENCOUNTER — Other Ambulatory Visit: Payer: Self-pay | Admitting: *Deleted

## 2011-08-27 MED ORDER — MINOXIDIL 10 MG PO TABS: 10.0000 mg | ORAL_TABLET | Freq: Two times a day (BID) | ORAL | Status: DC

## 2011-08-27 MED ORDER — CLONIDINE HCL 0.2 MG PO TABS: 0.4000 mg | ORAL_TABLET | Freq: Three times a day (TID) | ORAL | Status: DC

## 2011-08-27 MED ORDER — CARVEDILOL PHOSPHATE ER 40 MG PO CP24: 40.0000 mg | ORAL_CAPSULE | Freq: Every day | ORAL | Status: DC

## 2011-09-28 ENCOUNTER — Other Ambulatory Visit: Payer: Self-pay | Admitting: *Deleted

## 2011-09-28 MED ORDER — NITROGLYCERIN 0.4 MG SL SUBL: 0.4000 mg | SUBLINGUAL_TABLET | SUBLINGUAL | Status: DC | PRN

## 2011-10-05 ENCOUNTER — Telehealth: Payer: Self-pay | Admitting: Internal Medicine

## 2011-10-05 ENCOUNTER — Other Ambulatory Visit: Payer: Self-pay

## 2011-10-05 MED ORDER — LOSARTAN POTASSIUM-HCTZ 50-12.5 MG PO TABS: 2.0000 | ORAL_TABLET | Freq: Every day | ORAL | Status: DC

## 2011-10-05 NOTE — Telephone Encounter (Signed)
Pt calling re requested three refills and one didn't get called in, losartin, uses [facility] [address], and pt requesting that dr bensimhon personally  refer him to someone here because" his condition is worse than most of dr bensimhon's pts" , pls call

## 2011-10-05 NOTE — Telephone Encounter (Signed)
Please advise as to who pt should be followed by per his request.  Thanks.

## 2011-10-05 NOTE — Telephone Encounter (Signed)
Losartan/HCTZ refilled.

## 2011-10-06 NOTE — Telephone Encounter (Signed)
FU Call: Pt returning call to [facility]. Please call back.   Pt would also like to know who Dr. Haroldine Laws would recommend pt care be transferred to.   Please leave msg on pt vm with response.

## 2011-10-06 NOTE — Telephone Encounter (Signed)
Pt notified that I am checking with Dr Haroldine Laws and will call him back

## 2011-10-15 ENCOUNTER — Other Ambulatory Visit: Payer: Self-pay

## 2011-10-15 MED ORDER — CLOPIDOGREL BISULFATE 75 MG PO TABS: 75.0000 mg | ORAL_TABLET | Freq: Every day | ORAL | Status: DC

## 2011-11-12 ENCOUNTER — Ambulatory Visit: Admitting: Cardiovascular Disease

## 2011-11-18 ENCOUNTER — Other Ambulatory Visit: Payer: Self-pay | Admitting: Internal Medicine

## 2011-11-18 DIAGNOSIS — N281 Cyst of kidney, acquired: Secondary | ICD-10-CM

## 2011-12-25 ENCOUNTER — Ambulatory Visit
Admission: RE | Admit: 2011-12-25 | Discharge: 2011-12-25 | Disposition: A | Discharge status: Home / Self Care | Source: Ambulatory Visit | Attending: Internal Medicine | Admitting: Internal Medicine

## 2011-12-25 DIAGNOSIS — N281 Cyst of kidney, acquired: Secondary | ICD-10-CM

## 2012-01-15 ENCOUNTER — Ambulatory Visit (INDEPENDENT_AMBULATORY_CARE_PROVIDER_SITE_OTHER): Admitting: Cardiovascular Disease

## 2012-01-15 ENCOUNTER — Encounter: Payer: Self-pay | Admitting: Cardiovascular Disease

## 2012-01-15 VITALS — BP 140/68 | HR 69 | Ht 69.0 in | Wt 234.0 lb

## 2012-01-15 DIAGNOSIS — I2510 Atherosclerotic heart disease of native coronary artery without angina pectoris: Secondary | ICD-10-CM

## 2012-01-15 NOTE — Patient Instructions (Addendum)
Your physician wants you to follow-up in: 1 YEAR.  You will receive a reminder letter in the mail two months in advance. If you don't receive a letter, please call our office to schedule the follow-up appointment.  Your physician has recommended you make the following change in your medication: STOP Plavix

## 2012-01-17 ENCOUNTER — Encounter: Payer: Self-pay | Admitting: Cardiovascular Disease

## 2012-01-17 NOTE — Progress Notes (Signed)
HPI:  56 year-old male presenting for follow-up evaluation. The patient is followed for CAD. He underwent stenting of the RCA in 2011 with a DES platform after presenting with atypical angina. He had moderate distal vessel disease in the RCA and has done well with medical therapy. The patient works at [employer], and doesn't participate in regular physical exercise. He denies chest pain or pressure. He denies dyspnea, edema, or palpitations. Overall he feels well. He does a lot of work in the yard and has no symptoms with that level of activity.  The patient has very severe HTN, and has been on multiple medications. He has to take clonidine 3 times daily and this is difficult for him because of related fatigue.  Outpatient Encounter Prescriptions as of 01/15/2012  Medication Sig Dispense Refill  . amLODipine-benazepril (LOTREL) 10-40 MG per capsule TAKE 1 CAPSULE EVERY DAY  30 capsule  7  . aspirin 81 MG tablet Take 81 mg by mouth daily.      Marland Kitchen atorvastatin (LIPITOR) 20 MG tablet TAKE ONE TABLET BY MOUTH DAILY.  30 tablet  6  . B Complex-C (SUPER B COMPLEX PO) Take 1 tablet by mouth daily.        . carvedilol (COREG CR) 40 MG 24 hr capsule Take 1 capsule (40 mg total) by mouth daily.  30 capsule  6  . clonazePAM (KLONOPIN) 0.5 MG tablet Take 1 mg by mouth 2 (two) times daily.       . cloNIDine (CATAPRES) 0.2 MG tablet Take 2 tablets (0.4 mg total) by mouth 3 (three) times daily.  180 tablet  6  . Coenzyme Q10 (COQ-10) 400 MG CAPS Take 400 mg by mouth daily.      . furosemide (LASIX) 40 MG tablet TAKE ONE TABLET BY MOUTH DAILY.  30 tablet  11  . glyBURIDE-metformin (GLUCOVANCE) 2.5-500 MG per tablet Take 2 tablets by mouth 2 (two) times daily.        . insulin aspart protamine-insulin aspart (NOVOLOG MIX 70/30) (70-30) 100 UNIT/ML injection Sliding scale      . levocetirizine (XYZAL) 5 MG tablet Take 5 mg by mouth every evening.      Marland Kitchen losartan-hydrochlorothiazide (HYZAAR) 50-12.5 MG per  tablet Take 2 tablets by mouth daily.  60 tablet  2  . minoxidil (LONITEN) 10 MG tablet Take 1 tablet (10 mg total) by mouth 2 (two) times daily.  60 tablet  8  . nitroGLYCERIN (NITROSTAT) 0.4 MG SL tablet Place 1 tablet (0.4 mg total) under the tongue every 5 (five) minutes as needed.  25 tablet  11  . Omega-3 Fatty Acids (FISH OIL) 1200 MG CAPS Take by mouth daily.      . potassium chloride SA (K-DUR,KLOR-CON) 20 MEQ tablet Take 20 mEq by mouth 2 (two) times daily. Able to take an extra if needed      . Testosterone (ANDROGEL PUMP) 1.25 GM/ACT (1%) GEL as directed.       Marland Kitchen DISCONTD: clopidogrel (PLAVIX) 75 MG tablet Take 1 tablet (75 mg total) by mouth daily.  30 tablet  6  . DISCONTD: aspirin 325 MG EC tablet Take 325 mg by mouth daily.        Marland Kitchen DISCONTD: NON FORMULARY OXYGEN 2 LITERS. At bedtime as needed.         Allergies  Allergen Reactions  . Amoxicillin   . Amoxicillin-Pot Clavulanate   . Morphine   . Nifedipine   . Tramadol  Hives and itching    Past Medical History  Diagnosis Date  . CAD (coronary artery disease)     LAD 40-50%; CFX 30%; OM2 30%; RCA 70-80% (tx with DES), 50-60% distal/ Promus DES to RCA 06/05/2010 [facility] trial)/ EF 60% on cath  . Pancreatitis   . Hyperlipidemia   . Hypertension   . Diabetes mellitus   . 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 Obesity   . Dyspnea 05/2008    Myoview; TM/myoview 63% no ischemia/ scar  . Injury to carotid artery, unspecified 02/2007    0-39% bilateral; f/u 05/2010: 05-11% RICA; 0-21% LICA  . Bell's palsy 07/2009  . Insomnia   . PTSD (post-traumatic stress disorder)     ROS: Negative except as per HPI  BP 140/68  Pulse 69  Ht _0  (1.753 m)  Wt 106.142 kg (234 lb)  BMI 34.56 kg/m2  PHYSICAL EXAM: Pt is alert and oriented, overweight male in NAD HEENT: normal Neck: JVP - normal, carotids 2+= without bruits Lungs: CTA bilaterally CV: RRR without  murmur or gallop Abd: soft, NT, Positive BS, no hepatomegaly Ext: no C/C/E, distal pulses intact and equal Skin: warm/dry no rash  EKG:  NSR with nonspecific T wave abnormality, HR 69 bpm.  ASSESSMENT AND PLAN: 1. CAD s/p PCI. Pt is greater than 12 months from elective PCI with a second generation DES. He can stop plavix at low-risk of stent thrombosis.  2. Malignant HTN - meds reviewed and he is on a complex regimen. Continue same meds.  3. Dyslipidemia - lipids followed by Dr Brigitte Pulse. He is on a statin with atorvastatin 20 mg.  Sherren Mocha 01/17/2012 5:21 PM

## 2012-01-27 ENCOUNTER — Other Ambulatory Visit (HOSPITAL_COMMUNITY): Payer: Self-pay | Admitting: *Deleted

## 2012-01-27 MED ORDER — AMLODIPINE BESY-BENAZEPRIL HCL 10-40 MG PO CAPS: 1.0000 | ORAL_CAPSULE | Freq: Every day | ORAL | Status: DC

## 2012-01-28 ENCOUNTER — Other Ambulatory Visit (HOSPITAL_COMMUNITY): Payer: Self-pay

## 2012-01-28 MED ORDER — AMLODIPINE BESY-BENAZEPRIL HCL 10-40 MG PO CAPS: 1.0000 | ORAL_CAPSULE | Freq: Every day | ORAL | Status: DC

## 2012-01-28 MED ORDER — LOSARTAN POTASSIUM-HCTZ 50-12.5 MG PO TABS: 2.0000 | ORAL_TABLET | Freq: Every day | ORAL | Status: DC

## 2012-01-28 NOTE — Telephone Encounter (Signed)
..  Requested Prescriptions   Signed Prescriptions Disp Refills  . losartan-hydrochlorothiazide (HYZAAR) 50-12.5 MG per tablet 60 tablet 6    Sig: Take 2 tablets by mouth daily.    Authorizing Provider: Jolaine Artist    Ordering User: Ardis Hughs, Jamae Tison Jerilynn Mages

## 2012-01-28 NOTE — Telephone Encounter (Signed)
..  Requested Prescriptions   Pending Prescriptions Disp Refills  . amLODipine-benazepril (LOTREL) 10-40 MG per capsule 90 capsule 3    Sig: Take 1 capsule by mouth daily.

## 2012-02-22 ENCOUNTER — Other Ambulatory Visit (HOSPITAL_COMMUNITY): Payer: Self-pay | Admitting: Adult Health

## 2012-02-22 MED ORDER — CLONIDINE HCL 0.2 MG PO TABS: 0.4000 mg | ORAL_TABLET | Freq: Three times a day (TID) | ORAL | Status: DC

## 2012-02-22 MED ORDER — CARVEDILOL PHOSPHATE ER 40 MG PO CP24: 40.0000 mg | ORAL_CAPSULE | Freq: Every day | ORAL | Status: DC

## 2012-02-22 MED ORDER — ATORVASTATIN CALCIUM 20 MG PO TABS: 20.0000 mg | ORAL_TABLET | Freq: Every day | ORAL | Status: DC

## 2012-02-23 ENCOUNTER — Other Ambulatory Visit (HOSPITAL_COMMUNITY): Payer: Self-pay | Admitting: Adult Health

## 2012-02-23 MED ORDER — ATORVASTATIN CALCIUM 20 MG PO TABS: 20.0000 mg | ORAL_TABLET | Freq: Every day | ORAL | Status: DC

## 2012-03-18 ENCOUNTER — Other Ambulatory Visit (HOSPITAL_COMMUNITY): Payer: Self-pay | Admitting: *Deleted

## 2012-03-18 MED ORDER — CARVEDILOL PHOSPHATE ER 40 MG PO CP24: 40.0000 mg | ORAL_CAPSULE | Freq: Every day | ORAL | Status: DC

## 2012-03-21 ENCOUNTER — Other Ambulatory Visit (HOSPITAL_COMMUNITY): Payer: Self-pay

## 2012-03-21 MED ORDER — CARVEDILOL PHOSPHATE ER 40 MG PO CP24: 40.0000 mg | ORAL_CAPSULE | Freq: Every day | ORAL | Status: DC

## 2012-03-21 NOTE — Telephone Encounter (Signed)
..  Requested Prescriptions   Signed Prescriptions Disp Refills  . carvedilol (COREG CR) 40 MG 24 hr capsule 30 capsule 6    Sig: Take 1 capsule (40 mg total) by mouth daily.    Authorizing Provider: Jolaine Artist    Ordering User: Ardis Hughs, Doryce Mcgregory Jerilynn Mages

## 2012-03-28 ENCOUNTER — Emergency Department (HOSPITAL_COMMUNITY)

## 2012-03-28 ENCOUNTER — Emergency Department (HOSPITAL_COMMUNITY)
Admission: EM | Admit: 2012-03-28 | Discharge: 2012-03-28 | Disposition: A | Discharge status: Home / Self Care | Attending: Emergency Medicine | Admitting: Emergency Medicine

## 2012-03-28 ENCOUNTER — Encounter (HOSPITAL_COMMUNITY): Payer: Self-pay

## 2012-03-28 ENCOUNTER — Telehealth: Payer: Self-pay | Admitting: Cardiovascular Disease

## 2012-03-28 DIAGNOSIS — Z79899 Other long term (current) drug therapy: Secondary | ICD-10-CM | POA: Insufficient documentation

## 2012-03-28 DIAGNOSIS — K8020 Calculus of gallbladder without cholecystitis without obstruction: Secondary | ICD-10-CM | POA: Insufficient documentation

## 2012-03-28 DIAGNOSIS — I2510 Atherosclerotic heart disease of native coronary artery without angina pectoris: Secondary | ICD-10-CM | POA: Insufficient documentation

## 2012-03-28 DIAGNOSIS — E669 Obesity, unspecified: Secondary | ICD-10-CM | POA: Insufficient documentation

## 2012-03-28 DIAGNOSIS — Z794 Long term (current) use of insulin: Secondary | ICD-10-CM | POA: Insufficient documentation

## 2012-03-28 DIAGNOSIS — E119 Type 2 diabetes mellitus without complications: Secondary | ICD-10-CM | POA: Insufficient documentation

## 2012-03-28 DIAGNOSIS — E785 Hyperlipidemia, unspecified: Secondary | ICD-10-CM | POA: Insufficient documentation

## 2012-03-28 DIAGNOSIS — Z9089 Acquired absence of other organs: Secondary | ICD-10-CM | POA: Insufficient documentation

## 2012-03-28 DIAGNOSIS — I10 Essential (primary) hypertension: Secondary | ICD-10-CM | POA: Insufficient documentation

## 2012-03-28 DIAGNOSIS — R109 Unspecified abdominal pain: Secondary | ICD-10-CM

## 2012-03-28 LAB — CBC WITH DIFFERENTIAL/PLATELET
Basophils Absolute: 0 10*3/uL (ref 0.0–0.1)
Basophils Relative: 0 % (ref 0–1)
Eosinophils Absolute: 0.6 10*3/uL (ref 0.0–0.7)
MCH: 27.2 pg (ref 26.0–34.0)
MCHC: 33.6 g/dL (ref 30.0–36.0)
Monocytes Absolute: 0.5 10*3/uL (ref 0.1–1.0)
Neutro Abs: 4.7 10*3/uL (ref 1.7–7.7)
Neutrophils Relative %: 62 % (ref 43–77)
RDW: 14.1 % (ref 11.5–15.5)

## 2012-03-28 LAB — POCT I-STAT TROPONIN I: Troponin i, poc: 0 ng/mL (ref 0.00–0.08)

## 2012-03-28 LAB — HEPATIC FUNCTION PANEL
ALT: 65 U/L — ABNORMAL HIGH (ref 0–53)
Alkaline Phosphatase: 70 U/L (ref 39–117)
Bilirubin, Direct: <0.1 mg/dL (ref 0.0–0.3)
Total Bilirubin: 0.3 mg/dL (ref 0.3–1.2)
Total Protein: 7.7 g/dL (ref 6.0–8.3)

## 2012-03-28 LAB — LIPASE, BLOOD: Lipase: 30 U/L (ref 11–59)

## 2012-03-28 LAB — POCT I-STAT, CHEM 8
Calcium, Ion: 1.18 mmol/L (ref 1.12–1.23)
Chloride: 102 mEq/L (ref 96–112)
HCT: 42 % (ref 39.0–52.0)
Hemoglobin: 14.3 g/dL (ref 13.0–17.0)

## 2012-03-28 MED ORDER — HYDROMORPHONE HCL PF 1 MG/ML IJ SOLN
1.0000 mg | Freq: Once | INTRAMUSCULAR | Status: AC
  Administered 2012-03-28: 1 mg via INTRAVENOUS
  Filled 2012-03-28: qty 1

## 2012-03-28 MED ORDER — ONDANSETRON HCL 4 MG/2ML IJ SOLN
4.0000 mg | Freq: Once | INTRAMUSCULAR | Status: AC
  Administered 2012-03-28: 4 mg via INTRAVENOUS
  Filled 2012-03-28: qty 2

## 2012-03-28 MED ORDER — ONDANSETRON 8 MG PO TBDP: ORAL_TABLET | ORAL | Status: AC

## 2012-03-28 MED ORDER — HYDROMORPHONE HCL 2 MG PO TABS: 2.0000 mg | ORAL_TABLET | ORAL | Status: AC | PRN

## 2012-03-28 NOTE — ED Provider Notes (Signed)
History     CSN: [id number]  Arrival date & time 03/28/12  1419   First MD Initiated Contact with Patient 03/28/12 1726      Chief Complaint  Patient presents with  . Chest Pain    (Consider location/radiation/quality/duration/timing/severity/associated sxs/prior treatment) HPI Comments: Austin [name] is a 56 y.o. Male who has had left upper abdomen, and left low chest pain all day today. The pain waxes and wanes. It  never goes away completely. He has no associated shortness of breath, sweating, nausea, or vomiting. He has been belching. He is worried that it is either his gallbladder or his pancreas. Also, today. He had an episode of pain under his left axilla and he took a nitroglycerin with improvement, in  this discomfort . He has been able to eat today. He had a cardiac stent one year ago. He takes nitroglycerin about twice a month for pain beneath his left arm . He sees his cardiologist, and his PCP regularly. He has a history of necrotizing, pancreatitis, and was treated medically . There are no other aggravating or palliative factors .  The history is provided by the patient.    Past Medical History  Diagnosis Date  . CAD (coronary artery disease)     LAD 40-50%; CFX 30%; OM2 30%; RCA 70-80% (tx with DES), 50-60% distal/ Promus DES to RCA 06/05/2010 [facility] trial)/ EF 60% on cath  . Pancreatitis   . Hyperlipidemia   . Hypertension   . Diabetes mellitus   . 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 Obesity   . Dyspnea 05/2008    Myoview; TM/myoview 63% no ischemia/ scar  . Injury to carotid artery, unspecified 02/2007    0-39% bilateral; f/u 05/2010: 78-29% RICA; 5-62% LICA  . Bell's palsy 07/2009  . Insomnia   . PTSD (post-traumatic stress disorder)     Past Surgical History  Procedure Date  . Cardiac catheterization     LAD 40-50%; CFX 30%; OM2 30%; RCA 70-80% (tx with DES), 50-60% distal  . Lumbar disc  surgery     C5-6  . Appendectomy   . Tonsillectomy     Family History  Problem Relation Age of Onset  . Hypertension Other     History  Substance Use Topics  . Smoking status: Former Research scientist (life sciences)  . Smokeless tobacco: Not on file  . Alcohol Use: Yes      Review of Systems  All other systems reviewed and are negative.    Allergies  Morphine; Nifedipine; Tramadol; Amoxicillin; and Amoxicillin-pot clavulanate  Home Medications   Current Outpatient Rx  Name Route Sig Dispense Refill  . AMLODIPINE BESY-BENAZEPRIL HCL 10-40 MG PO CAPS Oral Take 1 capsule by mouth every morning.    . ASPIRIN 81 MG PO TABS Oral Take 81 mg by mouth daily.    . ATORVASTATIN CALCIUM 20 MG PO TABS Oral Take 20 mg by mouth every evening.    . SUPER B COMPLEX PO Oral Take 1 tablet by mouth daily.      Marland Kitchen CARVEDILOL PHOSPHATE ER 40 MG PO CP24 Oral Take 40 mg by mouth at bedtime.    Marland Kitchen CLONAZEPAM 1 MG PO TABS Oral Take 1 mg by mouth 2 (two) times daily.    Marland Kitchen CLONIDINE HCL 0.2 MG PO TABS Oral Take 0.2 mg by mouth See admin instructions. Pt can take up to 10 times a day    . COQ-10 400  MG PO CAPS Oral Take 400 mg by mouth daily.    . FUROSEMIDE 40 MG PO TABS Oral Take 40 mg by mouth every morning.    Marland Kitchen GLYBURIDE-METFORMIN 2.5-500 MG PO TABS Oral Take 2 tablets by mouth 2 (two) times daily.      . INSULIN ASPART PROT & ASPART (70-30) 100 UNIT/ML [place] SUSP Subcutaneous Inject 20-75 Units into the skin 3 (three) times daily with meals. Sliding scale. Pt will take between 20 -75 units depending on what he eats and sugar levels, i.e. Toast 20 units, i.e. [place] fries 75 units    . LEVOCETIRIZINE DIHYDROCHLORIDE 5 MG PO TABS Oral Take 5 mg by mouth every evening.    Marland Kitchen LOSARTAN POTASSIUM-HCTZ 50-12.5 MG PO TABS Oral Take 2 tablets by mouth every evening.    Marland Kitchen MINOXIDIL 10 MG PO TABS Oral Take 20 mg by mouth 2 (two) times daily.    Marland Kitchen NITROGLYCERIN 0.4 MG SL SUBL Sublingual Place 0.4 mg under the tongue every 5 (five)  minutes as needed. For chest pain    . FISH OIL 1200 MG PO CAPS Oral Take 1 capsule by mouth daily.     Marland Kitchen POTASSIUM CHLORIDE CRYS ER 20 MEQ PO TBCR Oral Take 20 mEq by mouth 2 (two) times daily.     . TESTOSTERONE 20.25 MG/1.25GM (1.62%) TD GEL Transdermal Place 4 application onto the skin every morning.    Marland Kitchen HYDROMORPHONE HCL 2 MG PO TABS Oral Take 1 tablet (2 mg total) by mouth every 4 (four) hours as needed for pain. 30 tablet 0  . ONDANSETRON 8 MG PO TBDP  21m ODT q4 hours prn nausea 20 tablet 0    BP 129/66  Pulse 81  Temp 98.1 F (36.7 C) (Oral)  Resp 18  SpO2 92%  Physical Exam  Nursing note and vitals reviewed. Constitutional: He is oriented to person, place, and time. He appears well-developed and well-nourished.  HENT:  Head: Normocephalic and atraumatic.  Right Ear: External ear normal.  Left Ear: External ear normal.  Eyes: Conjunctivae and EOM are normal. Pupils are equal, round, and reactive to light.  Neck: Normal range of motion and phonation normal. Neck supple.  Cardiovascular: Normal rate, regular rhythm, normal heart sounds and intact distal pulses.   Pulmonary/Chest: Effort normal and breath sounds normal. No respiratory distress. He has no wheezes. He exhibits no tenderness and no bony tenderness.  Abdominal: Soft. Normal appearance. He exhibits no mass. There is tenderness (Mild left upper). There is no rebound and no guarding.  Musculoskeletal: Normal range of motion.  Neurological: He is alert and oriented to person, place, and time. He has normal strength. No cranial nerve deficit or sensory deficit. He exhibits normal muscle tone. Coordination normal.  Skin: Skin is warm, dry and intact.  Psychiatric: He has a normal mood and affect. His behavior is normal. Judgment and thought content normal.    ED Course  Procedures (including critical care time)   Date: 03/28/2012  Rate: 74  Rhythm: normal sinus rhythm  QRS Axis: normal  Intervals: normal  ST/T  Wave abnormalities: Nonspecific T wave abnormality  Conduction Disutrbances:none  Narrative Interpretation:   Old EKG Reviewed: unchanged     Labs Reviewed  CBC WITH DIFFERENTIAL - Abnormal; Notable for the following:    Eosinophils Relative 8 (*)     All other components within normal limits  POCT I-STAT, CHEM 8 - Abnormal; Notable for the following:    Glucose, Bld 119 (*)  All other components within normal limits  HEPATIC FUNCTION PANEL - Abnormal; Notable for the following:    ALT 65 (*)     All other components within normal limits  POCT I-STAT TROPONIN I  GLUCOSE, CAPILLARY  LIPASE, BLOOD   US Abdomen Complete  03/28/2012  *RADIOLOGY REPORT*  Clinical Data:  Pain  COMPLETE ABDOMINAL ULTRASOUND  Comparison:  12/25/2011 and earlier studies  Findings:  Gallbladder:  There is a single mobile 6 x 13 mm stone in the region of the gallbladder neck.  There is no gallbladder wall thickening or pericholecystic fluid.  Sonographer reports no sonographic Murphy's sign.  Common bile duct:  2.6 mm diameter, unremarkable.  Liver:  Echogenic, without focal lesion or intrahepatic biliary ductal dilatation.  IVC:  Appears normal.  Pancreas:  Unremarkable, segments of the tail obscured by overlying bowel gas.  Spleen:  10.1 cm craniocaudal length, unremarkable.  Right Kidney:  10.7 cm. No hydronephrosis.  Well-preserved cortex. Normal size and parenchymal echotexture without focal abnormalities.  Left Kidney:  12.7 cm.  There is a 9 x 10 x 11 mm cyst laterally in the interpolar region.  No solid renal mass or hydronephrosis.  Abdominal aorta:  No aneurysm identified.  IMPRESSION:  1.  Cholelithiasis without other ultrasound evidence of cholecystitis or biliary obstruction. 2. Echogenic liver parenchyma, a nonspecific finding often associated with fatty infiltration.  Original Report Authenticated By: Dillard Cannon III, M.D.     1. Cholelithiases   2. Abdominal pain, acute       MDM    Nonspecific upper abdominal pain. I clinically doubt cardiac disease as a source. This pain is atypical for cardiac disease.  Labs, and chest x-ray did not reveal any cardiac or pulmonary abnormality. A gallstone present on the ultrasound; Patient stable for discharge.       Richarda Blade, MD 03/29/12 [phone]

## 2012-03-28 NOTE — Telephone Encounter (Signed)
Pt request return call [phone]  Patient has chest discomfort, left underarm discomfort, slight nausea, 1 nitro taken 12:20pm today

## 2012-03-28 NOTE — Telephone Encounter (Signed)
I spoke with the pt and he is complaining of symptoms that are off and on since this morning. The pt said he awoke this morning at 3 AM with sharp left sided chest pain that radiated into his back.  The pt said he just feels bad and as the morning progressed he has developed left arm discomfort and nausea.  The pt did take 1 NTG at 12:20 and this helped his symptoms.  I instructed the pt that he should go to the ER for further evaluation.  Pt agreed with plan and I notified Trish that the pt is coming to the hospital.

## 2012-03-28 NOTE — ED Notes (Signed)
Pt complains of chest pain, belching, left arm pain, sob, and not feeling well since this am, resolved with ntg.

## 2012-04-29 ENCOUNTER — Other Ambulatory Visit (INDEPENDENT_AMBULATORY_CARE_PROVIDER_SITE_OTHER): Payer: Self-pay | Admitting: General Surgery

## 2012-04-29 ENCOUNTER — Ambulatory Visit (INDEPENDENT_AMBULATORY_CARE_PROVIDER_SITE_OTHER): Admitting: General Surgery

## 2012-04-29 ENCOUNTER — Encounter (INDEPENDENT_AMBULATORY_CARE_PROVIDER_SITE_OTHER): Payer: Self-pay | Admitting: General Surgery

## 2012-04-29 VITALS — BP 112/70 | HR 84 | Temp 97.4°F | Resp 18 | Ht 69.0 in | Wt 236.4 lb

## 2012-04-29 DIAGNOSIS — R1012 Left upper quadrant pain: Secondary | ICD-10-CM

## 2012-04-29 DIAGNOSIS — K8020 Calculus of gallbladder without cholecystitis without obstruction: Secondary | ICD-10-CM | POA: Insufficient documentation

## 2012-04-29 NOTE — Patient Instructions (Addendum)
We will call you with the Gastroenterology referral appointment.  Avoid foods that cause your symptoms.

## 2012-04-29 NOTE — Progress Notes (Signed)
Patient ID: Austin [name], male   DOB: 04-Jan-1956, 56 y.o.   MRN: [id number]  No chief complaint on file.   HPI Austin [name] is a 56 y.o. male.   HPI  He is referred by Dr. Jules Husbands because of LUQ abdominal pain and cholelithiasis.  Over the past 18 months, he has had 4 episodes of severe LUQ pain radiating to the epigastrium and associated with nausea.  These events occurred after eating a fatty or spicy meal.  If he takes a Zantac prior to eating these foods, he does not seem to get the symptoms.  No weight loss.  No family history or gallbladder disease.  No jaundice or liver disease.  Recent US demonstrated cholelithiasis.   Past Medical History  Diagnosis Date  . CAD (coronary artery disease)     LAD 40-50%; CFX 30%; OM2 30%; RCA 70-80% (tx with DES), 50-60% distal/ Promus DES to RCA 06/05/2010 [facility] trial)/ EF 60% on cath  . Pancreatitis   . Hyperlipidemia   . Hypertension   . Diabetes mellitus   . 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 Obesity   . Dyspnea 05/2008    Myoview; TM/myoview 63% no ischemia/ scar  . Injury to carotid artery, unspecified 02/2007    0-39% bilateral; f/u 05/2010: 00-45% RICA; 9-97% LICA  . Bell's palsy 07/2009  . Insomnia   . PTSD (post-traumatic stress disorder)     Past Surgical History  Procedure Date  . Cardiac catheterization     LAD 40-50%; CFX 30%; OM2 30%; RCA 70-80% (tx with DES), 50-60% distal  . Lumbar disc surgery     C5-6  . Appendectomy   . Tonsillectomy     Family History  Problem Relation Age of Onset  . Hypertension Other     Social History History  Substance Use Topics  . Smoking status: Former Research scientist (life sciences)  . Smokeless tobacco: Not on file  . Alcohol Use: Yes    Allergies  Allergen Reactions  . Morphine Other (See Comments)    hallucinations  . Nifedipine Other (See Comments)    Increase in Blood Pressure  . Tramadol     Hives and itching  .  Amoxicillin Hives and Rash  . Amoxicillin-Pot Clavulanate Hives and Rash    Current Outpatient Prescriptions  Medication Sig Dispense Refill  . amLODipine-benazepril (LOTREL) 10-40 MG per capsule Take 1 capsule by mouth every morning.      Marland Kitchen aspirin 81 MG tablet Take 81 mg by mouth daily.      Marland Kitchen atorvastatin (LIPITOR) 20 MG tablet Take 20 mg by mouth every evening.      . B Complex-C (SUPER B COMPLEX PO) Take 1 tablet by mouth daily.        . carvedilol (COREG CR) 40 MG 24 hr capsule Take 40 mg by mouth at bedtime.      . clonazePAM (KLONOPIN) 1 MG tablet Take 1 mg by mouth 2 (two) times daily.      . cloNIDine (CATAPRES) 0.2 MG tablet Take 0.2 mg by mouth See admin instructions. Pt can take up to 10 times a day      . Coenzyme Q10 (COQ-10) 400 MG CAPS Take 400 mg by mouth daily.      . furosemide (LASIX) 40 MG tablet Take 40 mg by mouth every morning.      . glyBURIDE-metformin (GLUCOVANCE) 2.5-500 MG per tablet Take 2 tablets by mouth  2 (two) times daily.        . insulin aspart protamine-insulin aspart (NOVOLOG MIX 70/30) (70-30) 100 UNIT/ML injection Inject 20-75 Units into the skin 3 (three) times daily with meals. Sliding scale. Pt will take between 20 -75 units depending on what he eats and sugar levels, i.e. Toast 20 units, i.e. [place] fries 75 units      . levocetirizine (XYZAL) 5 MG tablet Take 5 mg by mouth every evening.      Marland Kitchen losartan-hydrochlorothiazide (HYZAAR) 50-12.5 MG per tablet Take 2 tablets by mouth every evening.      . minoxidil (LONITEN) 10 MG tablet Take 20 mg by mouth 2 (two) times daily.      . nitroGLYCERIN (NITROSTAT) 0.4 MG SL tablet Place 0.4 mg under the tongue every 5 (five) minutes as needed. For chest pain      . Omega-3 Fatty Acids (FISH OIL) 1200 MG CAPS Take 1 capsule by mouth daily.       . potassium chloride SA (K-DUR,KLOR-CON) 20 MEQ tablet Take 20 mEq by mouth 2 (two) times daily.       . Testosterone 20.25 MG/1.25GM (1.62%) GEL Place 4 application  onto the skin every morning.        Review of Systems Review of Systems  Constitutional: Negative for fever, chills and unexpected weight change.  HENT: Positive for hearing loss.   Eyes: Positive for visual disturbance.  Gastrointestinal: Positive for nausea and abdominal pain.  Musculoskeletal: Positive for arthralgias.    Blood pressure 112/70, pulse 84, temperature 97.4 F (36.3 C), temperature source Temporal, resp. rate 18, height _0  (1.753 m), weight 236 lb 6.4 oz (107.23 kg).  Physical Exam Physical Exam  Constitutional:       Overweight male in NAD.  HENT:  Head: Normocephalic and atraumatic.  Eyes: EOM are normal. No scleral icterus.  Cardiovascular: Normal rate and regular rhythm.   Pulmonary/Chest: Effort normal and breath sounds normal.  Abdominal: Soft. He exhibits no distension and no mass. There is no tenderness.       Reducible umbilical hernia.  Skin: Skin is warm and dry.    Data Reviewed ED visit note.  [place] report  Assessment    LUQ pain and cholelithiasis.  Atypical presentation for symptomatic cholelithiasis.  He could have gastric pathology.    Plan    Referral to GI for evaluation and EGD to r/o gastric pathology as the cause of his symptoms.  If this is unrevealing, then we can discuss proceeding with cholecystectomy.  I have explained the procedure, risks, and aftercare of cholecystectomy.  We discussed the possibility of continued pain in his situation.  Risks include but are not limited to bleeding, infection, wound problems, anesthesia, diarrhea, bile leak, injury to common bile duct/liver/intestine.  He seems to understand and agrees with the plan.  Will await the result of the GI evaluation.        Tyson Parkison J 04/29/2012, 10:04 PM

## 2012-05-09 ENCOUNTER — Other Ambulatory Visit: Payer: Self-pay | Admitting: *Deleted

## 2012-05-09 ENCOUNTER — Encounter: Payer: Self-pay | Admitting: Internal Medicine

## 2012-05-09 MED ORDER — FUROSEMIDE 40 MG PO TABS: 40.0000 mg | ORAL_TABLET | Freq: Every morning | ORAL | Status: DC

## 2012-05-23 ENCOUNTER — Telehealth (INDEPENDENT_AMBULATORY_CARE_PROVIDER_SITE_OTHER): Payer: Self-pay

## 2012-05-23 NOTE — Telephone Encounter (Signed)
Pt forgot appt with Dr. Michail Sermon.  He is out of town today, but said he would contact us later.

## 2012-07-12 ENCOUNTER — Telehealth (INDEPENDENT_AMBULATORY_CARE_PROVIDER_SITE_OTHER): Payer: Self-pay

## 2012-07-12 ENCOUNTER — Encounter (INDEPENDENT_AMBULATORY_CARE_PROVIDER_SITE_OTHER): Payer: Self-pay

## 2012-07-12 NOTE — Telephone Encounter (Signed)
Received benign path results from EGD at [facility].  LM for patient to call for f/u instructions.

## 2012-08-04 ENCOUNTER — Other Ambulatory Visit: Payer: Self-pay

## 2012-08-04 MED ORDER — LOSARTAN POTASSIUM-HCTZ 50-12.5 MG PO TABS: 2.0000 | ORAL_TABLET | Freq: Every evening | ORAL | Status: DC

## 2012-08-04 NOTE — Telephone Encounter (Signed)
Refill sent for losartan-hctz 50-12.5 mg take two tablets daily.

## 2012-08-26 ENCOUNTER — Encounter (INDEPENDENT_AMBULATORY_CARE_PROVIDER_SITE_OTHER): Payer: Self-pay | Admitting: General Surgery

## 2012-08-26 ENCOUNTER — Encounter (INDEPENDENT_AMBULATORY_CARE_PROVIDER_SITE_OTHER): Payer: Self-pay

## 2012-08-26 ENCOUNTER — Ambulatory Visit (INDEPENDENT_AMBULATORY_CARE_PROVIDER_SITE_OTHER): Admitting: General Surgery

## 2012-08-26 VITALS — BP 146/68 | HR 72 | Temp 98.0°F | Resp 18 | Ht 69.0 in | Wt 236.6 lb

## 2012-08-26 DIAGNOSIS — K8020 Calculus of gallbladder without cholecystitis without obstruction: Secondary | ICD-10-CM

## 2012-08-26 NOTE — Patient Instructions (Signed)
Lowfat diet. We will call you after we hear from Dr. Burt Knack.

## 2012-08-26 NOTE — Progress Notes (Signed)
Patient ID: Austin [name], male   DOB: 1955/09/11, 57 y.o.   MRN: [id number]  No chief complaint on file.   HPI Austin [name] is a 57 y.o. male.   HPI  He is here following his upper endoscopy and colonoscopy. He was found to have mild antral gastritis. He had a small tubular adenoma removed from the descending colon. He still having episodes of the left upper quadrant pain radiating around to the left back.This seems to be escalating.  Past Medical History  Diagnosis Date  . CAD (coronary artery disease)     LAD 40-50%; CFX 30%; OM2 30%; RCA 70-80% (tx with DES), 50-60% distal/ Promus DES to RCA 06/05/2010 [facility] trial)/ EF 60% on cath  . Pancreatitis   . Hyperlipidemia   . Hypertension   . Diabetes mellitus   . 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 Obesity   . Dyspnea 05/2008    Myoview; TM/myoview 63% no ischemia/ scar  . Injury to carotid artery, unspecified 02/2007    0-39% bilateral; f/u 05/2010: 53-64% RICA; 6-80% LICA  . Bell's palsy 07/2009  . Insomnia   . PTSD (post-traumatic stress disorder)     Past Surgical History  Procedure Date  . Cardiac catheterization     LAD 40-50%; CFX 30%; OM2 30%; RCA 70-80% (tx with DES), 50-60% distal  . Lumbar disc surgery     C5-6  . Appendectomy   . Tonsillectomy     Family History  Problem Relation Age of Onset  . Hypertension Other     Social History History  Substance Use Topics  . Smoking status: Former Research scientist (life sciences)  . Smokeless tobacco: Not on file  . Alcohol Use: Yes    Allergies  Allergen Reactions  . Morphine Other (See Comments)    hallucinations  . Nifedipine Other (See Comments)    Increase in Blood Pressure  . Tramadol     Hives and itching  . Amoxicillin Hives and Rash  . Amoxicillin-Pot Clavulanate Hives and Rash    Current Outpatient Prescriptions  Medication Sig Dispense Refill  . amLODipine-benazepril (LOTREL) 10-40 MG per capsule  Take 1 capsule by mouth every morning.      Marland Kitchen aspirin 81 MG tablet Take 81 mg by mouth daily.      Marland Kitchen atorvastatin (LIPITOR) 20 MG tablet Take 20 mg by mouth every evening.      . B Complex-C (SUPER B COMPLEX PO) Take 1 tablet by mouth daily.        . carvedilol (COREG CR) 40 MG 24 hr capsule Take 40 mg by mouth at bedtime.      . clonazePAM (KLONOPIN) 1 MG tablet Take 1 mg by mouth 2 (two) times daily.      . cloNIDine (CATAPRES) 0.2 MG tablet Take 0.2 mg by mouth See admin instructions. Pt can take up to 10 times a day      . Coenzyme Q10 (COQ-10) 400 MG CAPS Take 400 mg by mouth daily.      . furosemide (LASIX) 40 MG tablet Take 1 tablet (40 mg total) by mouth every morning.  30 tablet  9  . glyBURIDE-metformin (GLUCOVANCE) 2.5-500 MG per tablet Take 2 tablets by mouth 2 (two) times daily.        . insulin aspart protamine-insulin aspart (NOVOLOG MIX 70/30) (70-30) 100 UNIT/ML injection Inject 20-75 Units into the skin 3 (three) times daily with meals. Sliding scale.  Pt will take between 20 -75 units depending on what he eats and sugar levels, i.e. Toast 20 units, i.e. [place] fries 75 units      . levocetirizine (XYZAL) 5 MG tablet Take 5 mg by mouth every evening.      Marland Kitchen losartan-hydrochlorothiazide (HYZAAR) 50-12.5 MG per tablet Take 2 tablets by mouth every evening.  60 tablet  6  . minoxidil (LONITEN) 10 MG tablet Take 20 mg by mouth 2 (two) times daily.      . nitroGLYCERIN (NITROSTAT) 0.4 MG SL tablet Place 0.4 mg under the tongue every 5 (five) minutes as needed. For chest pain      . Omega-3 Fatty Acids (FISH OIL) 1200 MG CAPS Take 1 capsule by mouth daily.       . potassium chloride SA (K-DUR,KLOR-CON) 20 MEQ tablet Take 20 mEq by mouth 2 (two) times daily.       . Testosterone 20.25 MG/1.25GM (1.62%) GEL Place 4 application onto the skin every morning.        Review of Systems Review of Systems  Gastrointestinal: Negative for nausea.    Blood pressure 146/68, pulse 72,  temperature 98 F (36.7 C), temperature source Temporal, resp. rate 18, height 5' 9" (1.753 m), weight 236 lb 9.6 oz (107.321 kg).  Physical Exam Physical Exam  Constitutional: No distress.       Overweight male  Eyes: No scleral icterus.  Abdominal: Soft. He exhibits no mass. There is tenderness (In left upper quadrant area).    Data Reviewed Orbie Hurst note. Ultrasound report. Endoscopy reports.  Assessment    1. Cholelithiasis. 2. Left upper quadrant pain with known small left renal cyst. True etiology of this pain is still unknown.    Plan    We discussed proceeding with laparoscopic cholecystectomy as well as laparoscopic evaluation of the left upper quadrant region and he would like to move ahead with this.  I will send Dr. Burt Knack in note to make sure that no cardiac contraindications are present to proceeding with the operation.  The procedure, risks, and aftercare cholecystectomy have been discussed with him previously.       Aero Drummonds J 08/26/2012, 2:52 PM

## 2012-08-31 ENCOUNTER — Other Ambulatory Visit (INDEPENDENT_AMBULATORY_CARE_PROVIDER_SITE_OTHER): Payer: Self-pay | Admitting: General Surgery

## 2012-08-31 ENCOUNTER — Telehealth (INDEPENDENT_AMBULATORY_CARE_PROVIDER_SITE_OTHER): Payer: Self-pay

## 2012-08-31 ENCOUNTER — Other Ambulatory Visit (HOSPITAL_COMMUNITY): Payer: Self-pay | Admitting: Adult Health

## 2012-08-31 NOTE — Telephone Encounter (Signed)
Doroteo Bradford called from pre admit saying surgical orders need to be put in for patient. Pt scheduled for lap chole on 1/31

## 2012-09-01 ENCOUNTER — Encounter (HOSPITAL_COMMUNITY): Payer: Self-pay | Admitting: Pharmacy Technician

## 2012-09-02 ENCOUNTER — Encounter (HOSPITAL_COMMUNITY)
Admission: RE | Admit: 2012-09-02 | Discharge: 2012-09-02 | Disposition: A | Discharge status: Home / Self Care | Source: Ambulatory Visit | Attending: General Surgery | Admitting: General Surgery

## 2012-09-02 ENCOUNTER — Encounter (HOSPITAL_COMMUNITY)
Admission: RE | Admit: 2012-09-02 | Discharge: 2012-09-02 | Disposition: A | Discharge status: Home / Self Care | Source: Ambulatory Visit | Attending: Anesthesiology | Admitting: Anesthesiology

## 2012-09-02 ENCOUNTER — Ambulatory Visit (INDEPENDENT_AMBULATORY_CARE_PROVIDER_SITE_OTHER): Admitting: Physician Assistant

## 2012-09-02 ENCOUNTER — Encounter: Payer: Self-pay | Admitting: Physician Assistant

## 2012-09-02 ENCOUNTER — Encounter (HOSPITAL_COMMUNITY): Payer: Self-pay

## 2012-09-02 VITALS — BP 128/70 | HR 65 | Ht 69.0 in | Wt 235.1 lb

## 2012-09-02 DIAGNOSIS — I6529 Occlusion and stenosis of unspecified carotid artery: Secondary | ICD-10-CM

## 2012-09-02 DIAGNOSIS — Z01810 Encounter for preprocedural cardiovascular examination: Secondary | ICD-10-CM

## 2012-09-02 DIAGNOSIS — I2510 Atherosclerotic heart disease of native coronary artery without angina pectoris: Secondary | ICD-10-CM

## 2012-09-02 DIAGNOSIS — I10 Essential (primary) hypertension: Secondary | ICD-10-CM

## 2012-09-02 LAB — CBC WITH DIFFERENTIAL/PLATELET
Eosinophils Absolute: 0.6 10*3/uL (ref 0.0–0.7)
Eosinophils Relative: 7 % — ABNORMAL HIGH (ref 0–5)
HCT: 35 % — ABNORMAL LOW (ref 39.0–52.0)
Lymphocytes Relative: 26 % (ref 12–46)
Lymphs Abs: 2.2 10*3/uL (ref 0.7–4.0)
MCH: 25.7 pg — ABNORMAL LOW (ref 26.0–34.0)
MCV: 78.3 fL (ref 78.0–100.0)
Monocytes Absolute: 0.7 10*3/uL (ref 0.1–1.0)
RBC: 4.47 MIL/uL (ref 4.22–5.81)
WBC: 8.4 10*3/uL (ref 4.0–10.5)

## 2012-09-02 LAB — PROTIME-INR
INR: 1 (ref 0.00–1.49)
Prothrombin Time: 13.1 seconds (ref 11.6–15.2)

## 2012-09-02 LAB — COMPREHENSIVE METABOLIC PANEL
BUN: 14 mg/dL (ref 6–23)
CO2: 29 mEq/L (ref 19–32)
Calcium: 9.1 mg/dL (ref 8.4–10.5)
Creatinine, Ser: 0.75 mg/dL (ref 0.50–1.35)
GFR calc Af Amer: >90 mL/min (ref >90)
GFR calc non Af Amer: >90 mL/min (ref >90)
Glucose, Bld: 62 mg/dL — ABNORMAL LOW (ref 70–99)

## 2012-09-02 NOTE — Patient Instructions (Addendum)
Your physician wants you to follow-up in:  May 2014 with Dr. Burt Knack.You will receive a reminder letter in the mail two months in advance. If you don't receive a letter, please call our office to schedule the follow-up appointment.  Your physician has requested that you have a carotid duplex. This test is an ultrasound of the carotid arteries in your neck. It looks at blood flow through these arteries that supply the brain with blood. Allow one hour for this exam. There are no restrictions or special instructions.

## 2012-09-02 NOTE — Progress Notes (Addendum)
Sees Dr. Burt Knack.Marland KitchenMarland KitchenCardio notes in epic and in chart.  No complaints at this time...da Requested 2nd sleep study test from [place] sleep at [facility]....DA

## 2012-09-02 NOTE — Progress Notes (Signed)
09/02/12 1416  OBSTRUCTIVE SLEEP APNEA  Have you ever been diagnosed with sleep apnea through a sleep study? No (BEEN TESTED TWICE....NEGATIVE X 2)  Do you snore loudly (loud enough to be heard through closed doors)?  1  Do you often feel tired, fatigued, or sleepy during the daytime? 1 (D/T CLONIDINE)  Do you have, or are you being treated for high blood pressure? 1  BMI more than 35 kg/m2? 0  Age over 58 years old? 1  Neck circumference greater than 40 cm/18 inches? 0  Gender: 1  Obstructive Sleep Apnea Score 5   Score 4 or greater  Results sent to PCP

## 2012-09-02 NOTE — Progress Notes (Signed)
[address], [place], [place]  [address] Phone: [phone], Fax:  [phone]  Date:  09/02/2012   ID:  Austin [name], DOB:  06-26-1956, MRN:  [id number]  PCP:  Janalyn Rouse, MD Primary Cardiologist:  Dr. Sherren Mocha     History of Present Illness: Austin [name] is a 57 y.o. male who returns for surgical clearance. He is considering laparoscopic cholecystectomy with Dr. Zella Richer.   He has a hx of CAD, s/p DES to the RCA in 10/11 with mild to moderate residual nonobstructive disease in the LAD and circumflex, preserved LV function, DM2, HTN, HL. Myoview 2/12: No ischemia, mild apical thinning, EF 55%. Echo 4/11: Mild focal basal and mild concentric hypertrophy the septum, EF 63-81%, grade 2 diastolic dysfunction, mild LAE.  Last seen by Dr. Sherren Mocha 6/13.  Since last being seen, he is doing well.  The patient denies chest pain, shortness of breath, syncope, orthopnea, PND or significant pedal edema. He continues to hunt and can carry a 40 lb pack into the woods and climb up into a tree stand without chest pain or dyspnea.  He can achieve > 4 METs.  Labs (8/13):   K 3.6, creatinine 1.00, ALT 65, Hgb 14.3 Abd U/S (8/13):  Neg for AAA   Wt Readings from Last 3 Encounters:  09/02/12 235 lb 1.9 oz (106.65 kg)  08/26/12 236 lb 9.6 oz (107.321 kg)  04/29/12 236 lb 6.4 oz (107.23 kg)     Past Medical History  Diagnosis Date  . CAD (coronary artery disease)     a. LHC 10/11:  LAD 40-50%; CFX 30%; OM2 30%; RCA 70-80% (tx with DES), 50-60% distal/ Promus DES to RCA 06/05/2010 [facility] trial)/ EF 60% on cath;  b.  Myoview 2/12: No ischemia, mild apical thinning, EF 55%;   c.  Echo 4/11: Mild focal basal and mild concentric hypertrophy the septum, EF 77-11%, grade 2 diastolic dysfunction, mild LAE  . Pancreatitis   . Hyperlipidemia   . Hypertension   . Diabetes mellitus   . 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 Obesity   . Dyspnea 05/2008    Myoview; TM/myoview 63% no ischemia/ scar  . Carotid stenosis 02/2007    0-39% bilateral; f/u 05/2010: 65-79% RICA; 0-38% LICA  . Bell's palsy 07/2009  . Insomnia   . PTSD (post-traumatic stress disorder)     Current Outpatient Prescriptions  Medication Sig Dispense Refill  . amLODipine-benazepril (LOTREL) 10-40 MG per capsule Take 1 capsule by mouth every morning.      Marland Kitchen aspirin 81 MG tablet Take 81 mg by mouth daily.      Marland Kitchen atorvastatin (LIPITOR) 20 MG tablet Take 20 mg by mouth every evening.      . B Complex-C (SUPER B COMPLEX PO) Take 1 tablet by mouth daily.        . carvedilol (COREG CR) 40 MG 24 hr capsule Take 40 mg by mouth at bedtime.      . cloNIDine (CATAPRES) 0.2 MG tablet Take 0.2 mg by mouth See admin instructions. Pt can take up to 10 times a day      . cloNIDine (CATAPRES) 0.2 MG tablet TAKE 2 TABLETS (0.4 MG TOTAL) BY MOUTH 3 (THREE) TIMES DAILY.  180 tablet  6  . Coenzyme Q10 (COQ-10) 400 MG CAPS Take 400 mg by mouth daily.      . furosemide (LASIX) 40  MG tablet Take 1 tablet (40 mg total) by mouth every morning.  30 tablet  9  . glyBURIDE-metformin (GLUCOVANCE) 2.5-500 MG per tablet Take 2 tablets by mouth 2 (two) times daily.        . insulin aspart protamine-insulin aspart (NOVOLOG MIX 70/30) (70-30) 100 UNIT/ML injection Inject 20-75 Units into the skin 3 (three) times daily with meals. Sliding scale. Pt will take between 20 -75 units depending on what he eats and sugar levels, i.e. Toast 20 units, i.e. [place] fries 75 units      . levocetirizine (XYZAL) 5 MG tablet Take 5 mg by mouth every evening.      Marland Kitchen losartan-hydrochlorothiazide (HYZAAR) 50-12.5 MG per tablet Take 2 tablets by mouth daily at 12 noon.      . minoxidil (LONITEN) 10 MG tablet Take 20 mg by mouth 2 (two) times daily.      . nitroGLYCERIN (NITROSTAT) 0.4 MG SL tablet Place 0.4 mg under the tongue every 5 (five) minutes as needed. For chest  pain      . Omega-3 Fatty Acids (FISH OIL) 1200 MG CAPS Take 1 capsule by mouth daily.       . potassium chloride SA (K-DUR,KLOR-CON) 20 MEQ tablet Take 20 mEq by mouth 2 (two) times daily.       . Testosterone 20.25 MG/1.25GM (1.62%) GEL Place 4 application onto the skin every morning.        Allergies:    Allergies  Allergen Reactions  . Darvon (Propoxyphene) Other (See Comments)    Unknown   . Morphine Other (See Comments)    hallucinations  . Nifedipine Other (See Comments)    Increase in Blood Pressure  . Tramadol     Hives and itching  . Amoxicillin Hives and Rash  . Amoxicillin-Pot Clavulanate Hives and Rash    Social History:  The patient  reports that he has quit smoking. He does not have any smokeless tobacco history on file. He reports that he drinks alcohol. He reports that he does not use illicit drugs.   ROS:  Please see the history of present illness.      All other systems reviewed and negative.   PHYSICAL EXAM: VS:  BP 128/70  Pulse 65  Ht _0  (1.753 m)  Wt 235 lb 1.9 oz (106.65 kg)  BMI 34.72 kg/m2 Well nourished, well developed, in no acute distress HEENT: normal Neck: no JVD Cardiac:  normal S1, S2; RRR; no murmur Lungs:  clear to auscultation bilaterally, no wheezing, rhonchi or rales Abd: soft, nontender, no hepatomegaly Ext: no edema Skin: warm and dry Neuro:  CNs 2-12 intact, no focal abnormalities noted  EKG:  NSR, HR 65, normal axis, nonspecific ST-T wave changes     ASSESSMENT AND PLAN:  1. Surgical Clearance:   He can achieve > 4 METs without symptoms of angina.  He does not have any unstable cardiac conditions.  According to ACC/AHA guidelines, he does not require any further cardiac evaluation prior to his non-cardiac surgery and should be at acceptable risk.  Our service is available as necessary.  He should remain on ASA to reduce the risk of late stent thrombosis.  If the bleeding risk is too great to continue ASA, it should be  re-started as soon as possible after surgery.  He should also remain on his beta blocker to reduce the risk of cardiovascular complications.   2. Coronary Artery Disease:   No angina.  Continue ASA throughout surgery.  Continue statin. 3. Carotid Stenosis:  Arrange follow up dopplers. 4. Hypertension:  Controlled.  Continue current therapy.  5. Hyperlipidemia:  Managed by PCP.  6. Disposition:  Follow up with Dr. Sherren Mocha in 12/2012.  Signed, Richardson Dopp, PA-C  9:08 AM 09/02/2012

## 2012-09-02 NOTE — Progress Notes (Signed)
09/02/12 1425  OBSTRUCTIVE SLEEP APNEA  Score 4 or greater  Results sent to PCP

## 2012-09-15 MED ORDER — CIPROFLOXACIN IN D5W 400 MG/200ML IV SOLN
400.0000 mg | INTRAVENOUS | Status: AC
  Administered 2012-09-16: 400 mg via INTRAVENOUS
  Filled 2012-09-15: qty 200

## 2012-09-16 ENCOUNTER — Encounter (HOSPITAL_COMMUNITY)
Admission: RE | Disposition: A | Discharge status: Home / Self Care | Payer: Self-pay | Source: Ambulatory Visit | Attending: General Surgery

## 2012-09-16 ENCOUNTER — Ambulatory Visit (HOSPITAL_COMMUNITY)

## 2012-09-16 ENCOUNTER — Encounter (HOSPITAL_COMMUNITY): Payer: Self-pay | Admitting: Anesthesiology

## 2012-09-16 ENCOUNTER — Ambulatory Visit (HOSPITAL_COMMUNITY)
Admission: RE | Admit: 2012-09-16 | Discharge: 2012-09-16 | Disposition: A | Discharge status: Home / Self Care | Source: Ambulatory Visit | Attending: General Surgery | Admitting: General Surgery

## 2012-09-16 ENCOUNTER — Ambulatory Visit (HOSPITAL_COMMUNITY): Admitting: Anesthesiology

## 2012-09-16 DIAGNOSIS — E119 Type 2 diabetes mellitus without complications: Secondary | ICD-10-CM | POA: Insufficient documentation

## 2012-09-16 DIAGNOSIS — I10 Essential (primary) hypertension: Secondary | ICD-10-CM | POA: Insufficient documentation

## 2012-09-16 DIAGNOSIS — K8010 Calculus of gallbladder with chronic cholecystitis without obstruction: Secondary | ICD-10-CM

## 2012-09-16 DIAGNOSIS — Z01818 Encounter for other preprocedural examination: Secondary | ICD-10-CM | POA: Insufficient documentation

## 2012-09-16 DIAGNOSIS — G510 Bell's palsy: Secondary | ICD-10-CM | POA: Insufficient documentation

## 2012-09-16 DIAGNOSIS — Z01812 Encounter for preprocedural laboratory examination: Secondary | ICD-10-CM | POA: Insufficient documentation

## 2012-09-16 DIAGNOSIS — K8020 Calculus of gallbladder without cholecystitis without obstruction: Secondary | ICD-10-CM | POA: Insufficient documentation

## 2012-09-16 HISTORY — PX: CHOLECYSTECTOMY: SHX55

## 2012-09-16 LAB — GLUCOSE, CAPILLARY: Glucose-Capillary: 130 mg/dL — ABNORMAL HIGH (ref 70–99)

## 2012-09-16 SURGERY — LAPAROSCOPIC CHOLECYSTECTOMY WITH INTRAOPERATIVE CHOLANGIOGRAM
Anesthesia: General | Site: Abdomen | Wound class: Contaminated

## 2012-09-16 MED ORDER — ROCURONIUM BROMIDE 100 MG/10ML IV SOLN
INTRAVENOUS | Status: DC | PRN
  Administered 2012-09-16: 50 mg via INTRAVENOUS

## 2012-09-16 MED ORDER — NEOSTIGMINE METHYLSULFATE 1 MG/ML IJ SOLN
INTRAMUSCULAR | Status: DC | PRN
  Administered 2012-09-16: 5 mg via INTRAVENOUS

## 2012-09-16 MED ORDER — SODIUM CHLORIDE 0.9 % IV SOLN: 250.0000 mL | INTRAVENOUS | Status: DC | PRN

## 2012-09-16 MED ORDER — HYDROCODONE-ACETAMINOPHEN 5-325 MG PO TABS: 1.0000 | ORAL_TABLET | ORAL | Status: DC | PRN

## 2012-09-16 MED ORDER — ACETAMINOPHEN 325 MG PO TABS: 650.0000 mg | ORAL_TABLET | ORAL | Status: DC | PRN

## 2012-09-16 MED ORDER — LACTATED RINGERS IV SOLN
INTRAVENOUS | Status: DC | PRN
  Administered 2012-09-16 (×2): via INTRAVENOUS

## 2012-09-16 MED ORDER — SODIUM CHLORIDE 0.9 % IV SOLN
INTRAVENOUS | Status: DC | PRN
  Administered 2012-09-16: 12:00:00

## 2012-09-16 MED ORDER — BUPIVACAINE-EPINEPHRINE 0.25% -1:200000 IJ SOLN
INTRAMUSCULAR | Status: DC | PRN
  Administered 2012-09-16: 30 mL

## 2012-09-16 MED ORDER — HEMOSTATIC AGENTS (NO CHARGE) OPTIME
TOPICAL | Status: DC | PRN
  Administered 2012-09-16: 1 via TOPICAL

## 2012-09-16 MED ORDER — HYDROMORPHONE HCL PF 1 MG/ML IJ SOLN
0.2500 mg | INTRAMUSCULAR | Status: DC | PRN
  Administered 2012-09-16 (×3): 0.5 mg via INTRAVENOUS

## 2012-09-16 MED ORDER — HYDROMORPHONE HCL PF 1 MG/ML IJ SOLN
INTRAMUSCULAR | Status: AC
  Filled 2012-09-16: qty 1

## 2012-09-16 MED ORDER — KETOROLAC TROMETHAMINE 30 MG/ML IJ SOLN
30.0000 mg | Freq: Once | INTRAMUSCULAR | Status: AC
  Administered 2012-09-16: 30 mg via INTRAVENOUS

## 2012-09-16 MED ORDER — FENTANYL CITRATE 0.05 MG/ML IJ SOLN
INTRAMUSCULAR | Status: DC | PRN
  Administered 2012-09-16: 50 ug via INTRAVENOUS
  Administered 2012-09-16 (×2): 100 ug via INTRAVENOUS

## 2012-09-16 MED ORDER — PROPOFOL 10 MG/ML IV BOLUS
INTRAVENOUS | Status: DC | PRN
  Administered 2012-09-16: 200 mg via INTRAVENOUS

## 2012-09-16 MED ORDER — ONDANSETRON HCL 4 MG/2ML IJ SOLN
INTRAMUSCULAR | Status: DC | PRN
  Administered 2012-09-16: 4 mg via INTRAVENOUS

## 2012-09-16 MED ORDER — SODIUM CHLORIDE 0.9 % IV SOLN: INTRAVENOUS | Status: DC

## 2012-09-16 MED ORDER — PHENYLEPHRINE HCL 10 MG/ML IJ SOLN
INTRAMUSCULAR | Status: DC | PRN
  Administered 2012-09-16: 80 ug via INTRAVENOUS
  Administered 2012-09-16: 40 ug via INTRAVENOUS
  Administered 2012-09-16: 80 ug via INTRAVENOUS
  Administered 2012-09-16: 40 ug via INTRAVENOUS
  Administered 2012-09-16: 80 ug via INTRAVENOUS
  Administered 2012-09-16 (×2): 40 ug via INTRAVENOUS

## 2012-09-16 MED ORDER — LIDOCAINE HCL (CARDIAC) 20 MG/ML IV SOLN
INTRAVENOUS | Status: DC | PRN
  Administered 2012-09-16: 100 mg via INTRAVENOUS

## 2012-09-16 MED ORDER — METOCLOPRAMIDE HCL 5 MG/ML IJ SOLN: 10.0000 mg | Freq: Once | INTRAMUSCULAR | Status: DC | PRN

## 2012-09-16 MED ORDER — ONDANSETRON HCL 4 MG/2ML IJ SOLN: 4.0000 mg | Freq: Four times a day (QID) | INTRAMUSCULAR | Status: DC | PRN

## 2012-09-16 MED ORDER — SODIUM CHLORIDE 0.9 % IR SOLN
Status: DC | PRN
  Administered 2012-09-16: 1000 mL

## 2012-09-16 MED ORDER — KETOROLAC TROMETHAMINE 30 MG/ML IJ SOLN
INTRAMUSCULAR | Status: AC
  Filled 2012-09-16: qty 1

## 2012-09-16 MED ORDER — OXYCODONE HCL 5 MG/5ML PO SOLN: 5.0000 mg | Freq: Once | ORAL | Status: DC | PRN

## 2012-09-16 MED ORDER — HYDROMORPHONE HCL PF 1 MG/ML IJ SOLN: 0.5000 mg | INTRAMUSCULAR | Status: DC | PRN

## 2012-09-16 MED ORDER — OXYCODONE HCL 5 MG PO TABS: 5.0000 mg | ORAL_TABLET | Freq: Once | ORAL | Status: DC | PRN

## 2012-09-16 MED ORDER — GLYCOPYRROLATE 0.2 MG/ML IJ SOLN
INTRAMUSCULAR | Status: DC | PRN
  Administered 2012-09-16: .8 mg via INTRAVENOUS

## 2012-09-16 MED ORDER — LACTATED RINGERS IV SOLN
INTRAVENOUS | Status: DC
  Administered 2012-09-16: 11:00:00 via INTRAVENOUS

## 2012-09-16 MED ORDER — SODIUM CHLORIDE 0.9 % IR SOLN
Status: DC | PRN
  Administered 2012-09-16 (×2): 1000 mL

## 2012-09-16 MED ORDER — SODIUM CHLORIDE 0.9 % IJ SOLN: 3.0000 mL | Freq: Two times a day (BID) | INTRAMUSCULAR | Status: DC

## 2012-09-16 MED ORDER — SODIUM CHLORIDE 0.9 % IJ SOLN: 3.0000 mL | INTRAMUSCULAR | Status: DC | PRN

## 2012-09-16 MED ORDER — DEXAMETHASONE SODIUM PHOSPHATE 4 MG/ML IJ SOLN
INTRAMUSCULAR | Status: DC | PRN
  Administered 2012-09-16: 8 mg via INTRAVENOUS

## 2012-09-16 MED ORDER — BUPIVACAINE-EPINEPHRINE PF 0.25-1:200000 % IJ SOLN
INTRAMUSCULAR | Status: AC
  Filled 2012-09-16: qty 30

## 2012-09-16 MED ORDER — ARTIFICIAL TEARS OP OINT
TOPICAL_OINTMENT | OPHTHALMIC | Status: DC | PRN
  Administered 2012-09-16: 1 via OPHTHALMIC

## 2012-09-16 MED ORDER — ACETAMINOPHEN 650 MG RE SUPP: 650.0000 mg | RECTAL | Status: DC | PRN

## 2012-09-16 SURGICAL SUPPLY — 44 items
APL SKNCLS STERI-STRIP NONHPOA (GAUZE/BANDAGES/DRESSINGS)
APPLIER CLIP 5 13 M/L LIGAMAX5 (MISCELLANEOUS) ×2
APR CLP MED LRG 5 ANG JAW (MISCELLANEOUS) ×1
BAG SPEC RTRVL LRG 6X4 10 (ENDOMECHANICALS) ×1
BENZOIN TINCTURE PRP APPL 2/3 (GAUZE/BANDAGES/DRESSINGS) ×1 IMPLANT
BLADE SURG ROTATE 9660 (MISCELLANEOUS) ×1 IMPLANT
CANISTER SUCTION 2500CC (MISCELLANEOUS) ×2 IMPLANT
CHLORAPREP W/TINT 26ML (MISCELLANEOUS) ×2 IMPLANT
CLIP APPLIE 5 13 M/L LIGAMAX5 (MISCELLANEOUS) ×1 IMPLANT
CLOTH BEACON ORANGE TIMEOUT ST (SAFETY) ×2 IMPLANT
COVER MAYO STAND STRL (DRAPES) ×2 IMPLANT
COVER SURGICAL LIGHT HANDLE (MISCELLANEOUS) ×2 IMPLANT
DECANTER SPIKE VIAL GLASS SM (MISCELLANEOUS) ×3 IMPLANT
DRAPE C-ARM 42X72 X-RAY (DRAPES) ×2 IMPLANT
DRAPE UTILITY 15X26 W/TAPE STR (DRAPE) ×4 IMPLANT
ELECT REM PT RETURN 9FT ADLT (ELECTROSURGICAL) ×2
ELECTRODE REM PT RTRN 9FT ADLT (ELECTROSURGICAL) ×1 IMPLANT
GAUZE SPONGE 2X2 8PLY STRL LF (GAUZE/BANDAGES/DRESSINGS) ×1 IMPLANT
GLOVE BIO SURGEON STRL SZ7.5 (GLOVE) ×2 IMPLANT
GLOVE BIOGEL PI IND STRL 8 (GLOVE) ×1 IMPLANT
GLOVE BIOGEL PI INDICATOR 8 (GLOVE) ×2
GLOVE ECLIPSE 6.5 STRL STRAW (GLOVE) ×2 IMPLANT
GLOVE ECLIPSE 8.0 STRL XLNG CF (GLOVE) ×4 IMPLANT
GOWN STRL NON-REIN LRG LVL3 (GOWN DISPOSABLE) ×10 IMPLANT
HEMOSTAT SNOW SURGICEL 2X4 (HEMOSTASIS) ×1 IMPLANT
KIT BASIN OR (CUSTOM PROCEDURE TRAY) ×2 IMPLANT
KIT ROOM TURNOVER OR (KITS) ×2 IMPLANT
NS IRRIG 1000ML POUR BTL (IV SOLUTION) ×2 IMPLANT
PAD ARMBOARD 7.5X6 YLW CONV (MISCELLANEOUS) ×2 IMPLANT
POUCH SPECIMEN RETRIEVAL 10MM (ENDOMECHANICALS) ×2 IMPLANT
SCISSORS LAP 5X35 DISP (ENDOMECHANICALS) IMPLANT
SET CHOLANGIOGRAPH 5 50 .035 (SET/KITS/TRAYS/PACK) ×2 IMPLANT
SET IRRIG TUBING LAPAROSCOPIC (IRRIGATION / IRRIGATOR) ×2 IMPLANT
SLEEVE ENDOPATH XCEL 5M (ENDOMECHANICALS) ×4 IMPLANT
SPECIMEN JAR SMALL (MISCELLANEOUS) ×2 IMPLANT
SPONGE GAUZE 2X2 STER 10/PKG (GAUZE/BANDAGES/DRESSINGS)
STRIP CLOSURE SKIN 1/2X4 (GAUZE/BANDAGES/DRESSINGS) ×1 IMPLANT
SUT MON AB 4-0 PC3 18 (SUTURE) ×2 IMPLANT
TOWEL OR 17X24 6PK STRL BLUE (TOWEL DISPOSABLE) ×2 IMPLANT
TOWEL OR 17X26 10 PK STRL BLUE (TOWEL DISPOSABLE) ×2 IMPLANT
TRAY LAPAROSCOPIC (CUSTOM PROCEDURE TRAY) ×2 IMPLANT
TROCAR XCEL BLUNT TIP 100MML (ENDOMECHANICALS) ×2 IMPLANT
TROCAR XCEL NON-BLD 11X100MML (ENDOMECHANICALS) IMPLANT
TROCAR XCEL NON-BLD 5MMX100MML (ENDOMECHANICALS) ×2 IMPLANT

## 2012-09-16 NOTE — Anesthesia Procedure Notes (Signed)
Procedure Name: Intubation Date/Time: 09/16/2012 12:10 PM Performed by: Sherilyn Banker Pre-anesthesia Checklist: Patient identified, Emergency Drugs available, Suction available, Patient being monitored and Timeout performed Patient Re-evaluated:Patient Re-evaluated prior to inductionOxygen Delivery Method: Circle system utilized Preoxygenation: Pre-oxygenation with 100% oxygen Intubation Type: IV induction Laryngoscope Size: Mac and 4 Grade View: Grade II Tube type: Oral Tube size: 7.5 mm Number of attempts: 2 Airway Equipment and Method: Stylet and Video-laryngoscopy Placement Confirmation: ETT inserted through vocal cords under direct vision,  positive ETCO2 and breath sounds checked- equal and bilateral Secured at: 23 cm Tube secured with: Tape Dental Injury: Teeth and Oropharynx as per pre-operative assessment

## 2012-09-16 NOTE — H&P (View-Only) (Signed)
Patient ID: Austin [name], male   DOB: 08/28/1955, 57 y.o.   MRN: [id number]  No chief complaint on file.   HPI Austin [name] is a 57 y.o. male.   HPI  He is here following his upper endoscopy and colonoscopy. He was found to have mild antral gastritis. He had a small tubular adenoma removed from the descending colon. He still having episodes of the left upper quadrant pain radiating around to the left back.This seems to be escalating.  Past Medical History  Diagnosis Date  . CAD (coronary artery disease)     LAD 40-50%; CFX 30%; OM2 30%; RCA 70-80% (tx with DES), 50-60% distal/ Promus DES to RCA 06/05/2010 ([facility] trial)/ EF 60% on cath  . Pancreatitis   . Hyperlipidemia   . Hypertension   . Diabetes mellitus   . 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 Obesity   . Dyspnea 05/2008    Myoview; TM/myoview 63% no ischemia/ scar  . Injury to carotid artery, unspecified 02/2007    0-39% bilateral; f/u 05/2010: 40-59% RICA; 0-39% LICA  . Bell's palsy 07/2009  . Insomnia   . PTSD (post-traumatic stress disorder)     Past Surgical History  Procedure Date  . Cardiac catheterization     LAD 40-50%; CFX 30%; OM2 30%; RCA 70-80% (tx with DES), 50-60% distal  . Lumbar disc surgery     C5-6  . Appendectomy   . Tonsillectomy     Family History  Problem Relation Age of Onset  . Hypertension Other     Social History History  Substance Use Topics  . Smoking status: Former Smoker  . Smokeless tobacco: Not on file  . Alcohol Use: Yes    Allergies  Allergen Reactions  . Morphine Other (See Comments)    hallucinations  . Nifedipine Other (See Comments)    Increase in Blood Pressure  . Tramadol     Hives and itching  . Amoxicillin Hives and Rash  . Amoxicillin-Pot Clavulanate Hives and Rash    Current Outpatient Prescriptions  Medication Sig Dispense Refill  . amLODipine-benazepril (LOTREL) 10-40 MG per capsule  Take 1 capsule by mouth every morning.      . aspirin 81 MG tablet Take 81 mg by mouth daily.      . atorvastatin (LIPITOR) 20 MG tablet Take 20 mg by mouth every evening.      . B Complex-C (SUPER B COMPLEX PO) Take 1 tablet by mouth daily.        . carvedilol (COREG CR) 40 MG 24 hr capsule Take 40 mg by mouth at bedtime.      . clonazePAM (KLONOPIN) 1 MG tablet Take 1 mg by mouth 2 (two) times daily.      . cloNIDine (CATAPRES) 0.2 MG tablet Take 0.2 mg by mouth See admin instructions. Pt can take up to 10 times a day      . Coenzyme Q10 (COQ-10) 400 MG CAPS Take 400 mg by mouth daily.      . furosemide (LASIX) 40 MG tablet Take 1 tablet (40 mg total) by mouth every morning.  30 tablet  9  . glyBURIDE-metformin (GLUCOVANCE) 2.5-500 MG per tablet Take 2 tablets by mouth 2 (two) times daily.        . insulin aspart protamine-insulin aspart (NOVOLOG MIX 70/30) (70-30) 100 UNIT/ML injection Inject 20-75 Units into the skin 3 (three) times daily with meals. Sliding scale.   Pt will take between 20 -75 units depending on what he eats and sugar levels, i.e. Toast 20 units, i.e. [place] fries 75 units      . levocetirizine (XYZAL) 5 MG tablet Take 5 mg by mouth every evening.      Marland Kitchen losartan-hydrochlorothiazide (HYZAAR) 50-12.5 MG per tablet Take 2 tablets by mouth every evening.  60 tablet  6  . minoxidil (LONITEN) 10 MG tablet Take 20 mg by mouth 2 (two) times daily.      . nitroGLYCERIN (NITROSTAT) 0.4 MG SL tablet Place 0.4 mg under the tongue every 5 (five) minutes as needed. For chest pain      . Omega-3 Fatty Acids (FISH OIL) 1200 MG CAPS Take 1 capsule by mouth daily.       . potassium chloride SA (K-DUR,KLOR-CON) 20 MEQ tablet Take 20 mEq by mouth 2 (two) times daily.       . Testosterone 20.25 MG/1.25GM (1.62%) GEL Place 4 application onto the skin every morning.        Review of Systems Review of Systems  Gastrointestinal: Negative for nausea.    Blood pressure 146/68, pulse 72,  temperature 98 F (36.7 C), temperature source Temporal, resp. rate 18, height 5' 9" (1.753 m), weight 236 lb 9.6 oz (107.321 kg).  Physical Exam Physical Exam  Constitutional: No distress.       Overweight male  Eyes: No scleral icterus.  Abdominal: Soft. He exhibits no mass. There is tenderness (In left upper quadrant area).    Data Reviewed Orbie Hurst note. Ultrasound report. Endoscopy reports.  Assessment    1. Cholelithiasis. 2. Left upper quadrant pain with known small left renal cyst. True etiology of this pain is still unknown.    Plan    We discussed proceeding with laparoscopic cholecystectomy as well as laparoscopic evaluation of the left upper quadrant region and he would like to move ahead with this.  I will send Dr. Burt Knack in note to make sure that no cardiac contraindications are present to proceeding with the operation.  The procedure, risks, and aftercare cholecystectomy have been discussed with him previously.       Yolonda Purtle J 08/26/2012, 2:52 PM

## 2012-09-16 NOTE — Interval H&P Note (Signed)
History and Physical Interval Note:  09/16/2012 11:35 AM  Austin [name]  has presented today for surgery, with the diagnosis of syptomatic cholelithiasis  The various methods of treatment have been discussed with the patient and family. After consideration of risks, benefits and other options for treatment, the patient has consented to  Procedure(s) (LRB) with comments: LAPAROSCOPIC CHOLECYSTECTOMY WITH INTRAOPERATIVE CHOLANGIOGRAM (N/A) as a surgical intervention .  The patient's history has been reviewed, patient examined, no change in status, stable for surgery.  I have reviewed the patient's chart and labs.  Questions were answered to the patient's satisfaction.     Vincenza Dail Lenna Sciara

## 2012-09-16 NOTE — Transfer of Care (Signed)
Immediate Anesthesia Transfer of Care Note  Patient: Austin [name]  Procedure(s) Performed: Procedure(s) (LRB) with comments: LAPAROSCOPIC CHOLECYSTECTOMY WITH INTRAOPERATIVE CHOLANGIOGRAM (N/A)  Patient Location: PACU  Anesthesia Type:General  Level of Consciousness: awake and patient cooperative  Airway & Oxygen Therapy: Patient Spontanous Breathing and Patient connected to face mask oxygen  Post-op Assessment: Report given to PACU RN and Patient moving all extremities X 4  Post vital signs: Reviewed and stable  Complications: No apparent anesthesia complications

## 2012-09-16 NOTE — Anesthesia Preprocedure Evaluation (Signed)
Anesthesia Evaluation  Patient identified by MRN, date of birth, ID band Patient awake    Reviewed: Allergy & Precautions, H&P , NPO status , Patient's Chart, lab work & pertinent test results, reviewed documented beta blocker date and time   Airway Mallampati: II TM Distance: >3 FB Neck ROM: full    Dental   Pulmonary shortness of breath,  breath sounds clear to auscultation        Cardiovascular hypertension, On Home Beta Blockers + CAD, + Cardiac Stents and + Peripheral Vascular Disease Rhythm:regular     Neuro/Psych PSYCHIATRIC DISORDERS negative neurological ROS     GI/Hepatic negative GI ROS, Neg liver ROS,   Endo/Other  diabetes, Insulin Dependent  Renal/GU negative Renal ROS  negative genitourinary   Musculoskeletal   Abdominal   Peds  Hematology negative hematology ROS (+)   Anesthesia Other Findings See surgeon's H&P   Reproductive/Obstetrics negative OB ROS                           Anesthesia Physical Anesthesia Plan  ASA: III  Anesthesia Plan: General   Post-op Pain Management:    Induction: Intravenous  Airway Management Planned: Oral ETT  Additional Equipment:   Intra-op Plan:   Post-operative Plan: Extubation in OR  Informed Consent: I have reviewed the patients History and Physical, chart, labs and discussed the procedure including the risks, benefits and alternatives for the proposed anesthesia with the patient or authorized representative who has indicated his/her understanding and acceptance.   Dental Advisory Given  Plan Discussed with: CRNA and Surgeon  Anesthesia Plan Comments:         Anesthesia Quick Evaluation

## 2012-09-16 NOTE — Op Note (Signed)
Preoperative diagnosis:  Symptomatic cholelithiasis  Postoperative diagnosis:  Same  Procedure: Laparoscopic cholecystectomy with cholangiogram.  Surgeon: Jackolyn Confer, M.D.  Asst.:  Neysa Bonito M.D.  Anesthesia: Gen.  Indication:   This is a 57 year old male having left upper quadrant and epigastric pain following spicy and fatty meals. He has been found to have cholelithiasis. Upper endoscopy was unrevealing essentially. He now presents for elective cholecystectomy.  Technique: He was brought to the operating room, placed supine on the operating table, and a general anesthetic was administered. The hair on the abdominal wall was clipped as was necessary. The abdominal wall was then sterilely prepped and draped. Local anesthetic (Marcaine) was infiltrated in the subumbilical region. A small subumbilical incision was made through the skin, subcutaneous tissue, fascia, and peritoneum entering the peritoneal cavity under direct vision. A pursestring suture of 0 Vicryl was placed around the edges of the fascia. A Hassan trocar was introduced into the peritoneal cavity and a pneumoperitoneum was created by insufflation of carbon dioxide gas. The laparoscope was introduced into the trocar and no underlying bleeding or organ injury was noted. The patient was then placed in the reverse Trendelenburg position with the right side tilted slightly up.  Three more trochars were then placed into the abdominal cavity under laparoscopic vision. One in the epigastric area, and 2 in the right upper quadrant area.  Fatty infiltration of the liver changes were noted. The gallbladder was visualized and the fundus was grasped and retracted toward the right shoulder. Some bile spillage occurred during this maneuver secondary to a small puncture wound. There was however no spillage of gallstones. No acute inflammatory changes were noted.  The infundibulum was mobilized with dissection close to the gallbladder and  retracted laterally. The cystic duct was identified and a window was created around it. The cystic artery was also identified and a window was created around it. The critical view was achieved. A clip was placed at the neck of the gallbladder. A small incision was made in the cystic duct. A cholangiocatheter was introduced through the anterior abdominal wall and placed in the cystic duct. A intraoperative cholangiogram was then performed.  Under real-time fluoroscopy, dilute contrast was injected into the cystic duct.  The common hepatic duct, the right and left hepatic ducts, and the common duct were all visualized. Contrast drained into the duodenum without obvious evidence of any obstructing ductal lesion. The final report is pending the Radiologist's interpretation.  The cholangiocatheter was removed, the cystic duct was clipped 3 times on the biliary side, and then the cystic duct was divided sharply. No bile leak was noted from the cystic duct stump.  The cystic artery was then clipped and divided. Following this the gallbladder was dissected free from the liver using electrocautery. The gallbladder was then placed in a retrieval bag and removed from the abdominal cavity through the subumbilical incision.  The gallbladder fossa was inspected, irrigated, and bleeding was controlled with electrocautery. Inspection showed that hemostasis was adequate and there was no evidence of bile leak.  The irrigation fluid was evacuated as much as possible.  A piece of Surgicel was placed in the gallbladder fossa.  The subumbilical trocar was removed and the fascial defect was closed by tightening and tying down the pursestring suture under laparoscopic vision.  The remaining trocars were removed and the pneumoperitoneum was released. The skin incisions were closed with 4-0 Monocryl subcuticular stitches. Steri-Strips and sterile dressings were applied.  The procedure was well-tolerated without any apparent  complications. He was taken to the recovery room in satisfactory condition.

## 2012-09-16 NOTE — Anesthesia Postprocedure Evaluation (Signed)
Anesthesia Post Note  Patient: Austin [name]  Procedure(s) Performed: Procedure(s) (LRB): LAPAROSCOPIC CHOLECYSTECTOMY WITH INTRAOPERATIVE CHOLANGIOGRAM (N/A)  Anesthesia type: General  Patient location: PACU  Post pain: Pain level controlled  Post assessment: Patient's Cardiovascular Status Stable  Last Vitals:  Filed Vitals:   09/16/12 1451  BP: 117/53  Pulse: 78  Temp:   Resp: 16    Post vital signs: Reviewed and stable  Level of consciousness: alert  Complications: No apparent anesthesia complications

## 2012-09-16 NOTE — Preoperative (Signed)
Beta Blockers   Reason not to administer Beta Blockers:Coreg this am

## 2012-09-19 ENCOUNTER — Encounter (HOSPITAL_COMMUNITY): Payer: Self-pay | Admitting: General Surgery

## 2012-09-19 ENCOUNTER — Telehealth (INDEPENDENT_AMBULATORY_CARE_PROVIDER_SITE_OTHER): Payer: Self-pay | Admitting: General Surgery

## 2012-09-19 NOTE — Telephone Encounter (Signed)
Spoke with patient  Po f/u appt was made for 10/06/12 _0 :30 am

## 2012-09-23 ENCOUNTER — Encounter (INDEPENDENT_AMBULATORY_CARE_PROVIDER_SITE_OTHER)

## 2012-09-23 DIAGNOSIS — I6529 Occlusion and stenosis of unspecified carotid artery: Secondary | ICD-10-CM

## 2012-10-01 ENCOUNTER — Other Ambulatory Visit: Payer: Self-pay

## 2012-10-03 ENCOUNTER — Telehealth: Payer: Self-pay | Admitting: *Deleted

## 2012-10-03 ENCOUNTER — Other Ambulatory Visit (HOSPITAL_COMMUNITY): Payer: Self-pay | Admitting: *Deleted

## 2012-10-03 ENCOUNTER — Encounter: Payer: Self-pay | Admitting: Physician Assistant

## 2012-10-03 ENCOUNTER — Other Ambulatory Visit (HOSPITAL_COMMUNITY): Payer: Self-pay | Admitting: Adult Health

## 2012-10-03 MED ORDER — NITROGLYCERIN 0.4 MG SL SUBL: 0.4000 mg | SUBLINGUAL_TABLET | SUBLINGUAL | Status: DC | PRN

## 2012-10-03 NOTE — Telephone Encounter (Signed)
Message copied by Michae Kava on Mon Oct 03, 2012  3:17 PM ------      Message from: [place], [place] T      Created: Mon Oct 03, 2012  1:53 PM       Mild to moderate bilateral plaque.      40-59% bilateral ICA.      Left has increased from 0-39% at last assessment to 40-59%.      Repeat dopplers in 1 year.      Richardson Dopp, PA-C  1:52 PM 10/03/2012 ------

## 2012-10-03 NOTE — Telephone Encounter (Signed)
pt's wife notified about carotid doppler results with verbal understanding, she said I could call pt on his cell # [phone] as well. I lmptcb to go over test results

## 2012-10-05 ENCOUNTER — Other Ambulatory Visit (HOSPITAL_COMMUNITY): Payer: Self-pay | Admitting: *Deleted

## 2012-10-05 MED ORDER — NITROGLYCERIN 0.4 MG SL SUBL: 0.4000 mg | SUBLINGUAL_TABLET | SUBLINGUAL | Status: DC | PRN

## 2012-10-05 NOTE — Telephone Encounter (Signed)
pt notified about carotid doppler results with verbal understanding

## 2012-10-06 ENCOUNTER — Encounter (INDEPENDENT_AMBULATORY_CARE_PROVIDER_SITE_OTHER): Payer: Self-pay | Admitting: General Surgery

## 2012-10-06 ENCOUNTER — Ambulatory Visit (INDEPENDENT_AMBULATORY_CARE_PROVIDER_SITE_OTHER): Admitting: General Surgery

## 2012-10-06 VITALS — BP 118/62 | HR 74 | Resp 16 | Ht 69.0 in | Wt 230.0 lb

## 2012-10-06 DIAGNOSIS — Z9889 Other specified postprocedural states: Secondary | ICD-10-CM

## 2012-10-06 NOTE — Progress Notes (Signed)
Procedure:  Laparoscopic cholecystectomy  Date:  09/16/2012  Pathology:  Chronic cholecystitis and cholelithiasis  History:  He is here for his first postoperative visit and is doing well.  He had a little postprandial diarrhea the first week after surgery but this has resolved.  Exam: General- Is in NAD. Abdomen-incisions are clean and intact.  Assessment:  Doing well post cholecystectomy.  Plan:  Low-fat direct amended. Activities as tolerated. Return as needed.

## 2012-10-06 NOTE — Patient Instructions (Signed)
Low fat diet. Activities as tolerated.

## 2012-10-28 ENCOUNTER — Other Ambulatory Visit (HOSPITAL_COMMUNITY): Payer: Self-pay | Admitting: Adult Health

## 2012-10-29 ENCOUNTER — Other Ambulatory Visit (HOSPITAL_COMMUNITY): Payer: Self-pay | Admitting: Cardiovascular Disease

## 2012-11-30 ENCOUNTER — Other Ambulatory Visit (HOSPITAL_COMMUNITY): Payer: Self-pay | Admitting: Cardiovascular Disease

## 2012-12-16 ENCOUNTER — Ambulatory Visit: Admitting: Cardiovascular Disease

## 2012-12-23 ENCOUNTER — Encounter: Payer: Self-pay | Admitting: Cardiovascular Disease

## 2012-12-23 ENCOUNTER — Ambulatory Visit (INDEPENDENT_AMBULATORY_CARE_PROVIDER_SITE_OTHER): Admitting: Cardiovascular Disease

## 2012-12-23 VITALS — BP 130/66 | HR 72 | Ht 69.0 in | Wt 227.0 lb

## 2012-12-23 DIAGNOSIS — I2510 Atherosclerotic heart disease of native coronary artery without angina pectoris: Secondary | ICD-10-CM

## 2012-12-23 DIAGNOSIS — I119 Hypertensive heart disease without heart failure: Secondary | ICD-10-CM

## 2012-12-23 NOTE — Patient Instructions (Signed)
Your physician wants you to follow-up in: 1 YEAR with Dr Burt Knack.  You will receive a reminder letter in the mail two months in advance. If you don't receive a letter, please call our office to schedule the follow-up appointment.  Your physician recommends that you continue on your current medications as directed. Please refer to the Current Medication list given to you today.

## 2012-12-23 NOTE — Progress Notes (Signed)
HPI:  57 year old gentleman presenting for followup evaluation. The patient has coronary artery disease and underwent stenting of the right coronary artery in 2011 after presenting with atypical angina. He has severe hypertension and is on multiple medications. He takes clonidine at least 3 times daily.  He is not engaged in regular exercise. He is limited by joint pains. He denies exertional chest pain or tightness. He has dyspnea with exertion and attributes this to being out of shape. He denies leg swelling or orthopnea. He does have occasions where he wakes up at night and feels short of breath. He does some breathing exercises and this resolves shortly thereafter.  Outpatient Encounter Prescriptions as of 12/23/2012  Medication Sig Dispense Refill  . amLODipine-benazepril (LOTREL) 10-40 MG per capsule Take 1 capsule by mouth every morning.      Marland Kitchen aspirin 81 MG tablet Take 81 mg by mouth daily.      Marland Kitchen atorvastatin (LIPITOR) 20 MG tablet Take 20 mg by mouth every evening.      . B Complex-C (SUPER B COMPLEX PO) Take 1 tablet by mouth daily.        . carvedilol (COREG CR) 40 MG 24 hr capsule Take 40 mg by mouth at bedtime.      . cloNIDine (CATAPRES) 0.2 MG tablet Take 0.2 mg by mouth 6 (six) times daily.       . Coenzyme Q10 (COQ-10) 400 MG CAPS Take 400 mg by mouth daily.      Marland Kitchen COREG CR 40 MG 24 hr capsule TAKE 1 CAPSULE (40 MG TOTAL) BY MOUTH DAILY.  30 capsule  6  . furosemide (LASIX) 40 MG tablet Take 1 tablet (40 mg total) by mouth every morning.  30 tablet  9  . glyBURIDE-metformin (GLUCOVANCE) 2.5-500 MG per tablet Take 2 tablets by mouth 2 (two) times daily.        Marland Kitchen HYDROcodone-acetaminophen (NORCO/VICODIN) 5-325 MG per tablet Take 1-2 tablets by mouth every 4 (four) hours as needed for pain.  40 tablet  0  . insulin aspart protamine-insulin aspart (NOVOLOG MIX 70/30) (70-30) 100 UNIT/ML injection Inject 20-75 Units into the skin 3 (three) times daily with meals. Sliding scale. Pt  will take between 20 -75 units depending on what he eats and sugar levels, i.e. Toast 20 units, i.e. [place] fries 75 units      . levocetirizine (XYZAL) 5 MG tablet Take 5 mg by mouth every evening.      Marland Kitchen losartan-hydrochlorothiazide (HYZAAR) 50-12.5 MG per tablet Take 2 tablets by mouth daily at 12 noon.      . minoxidil (LONITEN) 10 MG tablet Take 20 mg by mouth 2 (two) times daily.      . nitroGLYCERIN (NITROSTAT) 0.4 MG SL tablet Place 1 tablet (0.4 mg total) under the tongue every 5 (five) minutes as needed. For chest pain  25 tablet  1  . Omega-3 Fatty Acids (FISH OIL) 1200 MG CAPS Take 1 capsule by mouth daily.       . potassium chloride SA (K-DUR,KLOR-CON) 20 MEQ tablet Take 20 mEq by mouth 2 (two) times daily.       . Testosterone 20.25 MG/1.25GM (1.62%) GEL Place 4 application onto the skin every morning.      . [DISCONTINUED] atorvastatin (LIPITOR) 20 MG tablet TAKE 1 TABLET (20 MG TOTAL) BY MOUTH DAILY.  30 tablet  4  . [DISCONTINUED] atorvastatin (LIPITOR) 20 MG tablet TAKE 1 TABLET (20 MG TOTAL) BY MOUTH DAILY.  30 tablet  6  . [DISCONTINUED] cloNIDine (CATAPRES) 0.2 MG tablet TAKE 2 TABLETS (0.4 MG TOTAL) BY MOUTH 3 (THREE) TIMES DAILY.  180 tablet  6  . [DISCONTINUED] COREG CR 40 MG 24 hr capsule TAKE 1 CAPSULE (40 MG TOTAL) BY MOUTH DAILY.  30 capsule  6   No facility-administered encounter medications on file as of 12/23/2012.    Allergies  Allergen Reactions  . Darvon (Propoxyphene) Other (See Comments)    Unknown   . Morphine Other (See Comments)    hallucinations  . Nifedipine Other (See Comments)    Increase in Blood Pressure  . Tramadol     Hives and itching  . Amoxicillin Hives and Rash  . Amoxicillin-Pot Clavulanate Hives and Rash    Past Medical History  Diagnosis Date  . CAD (coronary artery disease)     a. LHC 10/11:  LAD 40-50%; CFX 30%; OM2 30%; RCA 70-80% (tx with DES), 50-60% distal/ Promus DES to RCA 06/05/2010 [facility] trial)/ EF 60% on cath;   b.  Myoview 2/12: No ischemia, mild apical thinning, EF 55%;   c.  Echo 4/11: Mild focal basal and mild concentric hypertrophy the septum, EF 61-68%, grade 2 diastolic dysfunction, mild LAE  . Pancreatitis   . Hyperlipidemia   . Hypertension   . 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 Obesity   . Carotid stenosis 02/2007    a.  0-39% bilateral; b. dopplers 05/2010: 37-29% RICA; 0-21% LICA;  c. dopplers 08/1550:  40-59% bilat ICA (repeat 1 year)  . Bell's palsy 07/2009  . Insomnia   . PTSD (post-traumatic stress disorder)   . Dyspnea 05/2008    Myoview; TM/myoview 63% no ischemia/ scar  . Diabetes mellitus     SINCE 2004    ROS: Negative except as per HPI  BP 130/66  Pulse 72  Ht _0  (1.753 m)  Wt 102.967 kg (227 lb)  BMI 33.51 kg/m2  SpO2 96%  PHYSICAL EXAM: Pt is alert and oriented, pleasant overweight male in NAD HEENT: normal Neck: JVP - normal, carotids 2+= with a left carotid bruit Lungs: CTA bilaterally CV: RRR without murmur or gallop Abd: soft, NT, Positive BS, no hepatomegaly Ext: no C/C/E, distal pulses intact and equal Skin: warm/dry no rash  ASSESSMENT AND PLAN: 1. Coronary artery disease, native vessel. The patient has no anginal symptoms. He will continue his medical program which includes aspirin, a statin drug, and multiple antihypertensives.  2. Malignant hypertension. He's on a combination of amlodipine, benazepril, carvedilol, clonidine, losartan, hydrochlorothiazide, and minoxidil. He will continue his same program. He was counseled regarding the importance of weight loss and salt restriction.  3. Hyperlipidemia. Lipids followed by Dr. Brigitte Pulse.  I'll see him back in one year for followup. He appears stable from a cardiac perspective.  Sherren Mocha 12/23/2012 3:57 PM

## 2013-01-27 ENCOUNTER — Other Ambulatory Visit (HOSPITAL_COMMUNITY): Payer: Self-pay | Admitting: *Deleted

## 2013-01-27 MED ORDER — AMLODIPINE BESY-BENAZEPRIL HCL 10-40 MG PO CAPS: 1.0000 | ORAL_CAPSULE | Freq: Every morning | ORAL | Status: DC

## 2013-03-02 ENCOUNTER — Other Ambulatory Visit: Payer: Self-pay | Admitting: Cardiovascular Disease

## 2013-03-03 ENCOUNTER — Ambulatory Visit
Admission: RE | Admit: 2013-03-03 | Discharge: 2013-03-03 | Disposition: A | Discharge status: Home / Self Care | Source: Ambulatory Visit | Attending: Internal Medicine | Admitting: Internal Medicine

## 2013-03-03 ENCOUNTER — Other Ambulatory Visit: Payer: Self-pay | Admitting: Internal Medicine

## 2013-03-03 DIAGNOSIS — E041 Nontoxic single thyroid nodule: Secondary | ICD-10-CM

## 2013-03-03 NOTE — Telephone Encounter (Signed)
Fax Received. Refill Completed. Austin [name] (R.M.A)   

## 2013-03-31 ENCOUNTER — Other Ambulatory Visit (HOSPITAL_COMMUNITY): Payer: Self-pay | Admitting: Cardiovascular Disease

## 2013-04-13 ENCOUNTER — Other Ambulatory Visit: Payer: Self-pay

## 2013-04-13 MED ORDER — LOSARTAN POTASSIUM-HCTZ 50-12.5 MG PO TABS: 2.0000 | ORAL_TABLET | Freq: Every day | ORAL | Status: DC

## 2013-04-13 MED ORDER — CLONIDINE HCL 0.2 MG PO TABS: 0.2000 mg | ORAL_TABLET | Freq: Every day | ORAL | Status: DC

## 2013-04-13 MED ORDER — AMLODIPINE BESY-BENAZEPRIL HCL 10-40 MG PO CAPS: 1.0000 | ORAL_CAPSULE | Freq: Every morning | ORAL | Status: DC

## 2013-04-13 MED ORDER — CARVEDILOL PHOSPHATE ER 40 MG PO CP24: ORAL_CAPSULE | ORAL | Status: DC

## 2013-04-13 MED ORDER — FUROSEMIDE 40 MG PO TABS: ORAL_TABLET | ORAL | Status: DC

## 2013-04-13 MED ORDER — ATORVASTATIN CALCIUM 20 MG PO TABS: 20.0000 mg | ORAL_TABLET | Freq: Every evening | ORAL | Status: DC

## 2013-05-15 ENCOUNTER — Encounter: Payer: Self-pay | Admitting: Pediatrics

## 2013-05-15 ENCOUNTER — Other Ambulatory Visit: Payer: Self-pay

## 2013-05-15 MED ORDER — CARVEDILOL PHOSPHATE ER 40 MG PO CP24: ORAL_CAPSULE | ORAL | Status: DC

## 2013-05-15 MED ORDER — LOSARTAN POTASSIUM-HCTZ 50-12.5 MG PO TABS: 2.0000 | ORAL_TABLET | Freq: Every day | ORAL | Status: DC

## 2013-05-15 MED ORDER — CLONIDINE HCL 0.2 MG PO TABS: 0.2000 mg | ORAL_TABLET | Freq: Every day | ORAL | Status: DC

## 2013-05-15 MED ORDER — ATORVASTATIN CALCIUM 20 MG PO TABS: 20.0000 mg | ORAL_TABLET | Freq: Every evening | ORAL | Status: DC

## 2013-05-15 MED ORDER — FUROSEMIDE 40 MG PO TABS: ORAL_TABLET | ORAL | Status: DC

## 2013-05-15 MED ORDER — AMLODIPINE BESY-BENAZEPRIL HCL 10-40 MG PO CAPS: 1.0000 | ORAL_CAPSULE | Freq: Every morning | ORAL | Status: DC

## 2013-05-15 NOTE — Telephone Encounter (Signed)
  This encounter was created in error - please disregard.

## 2013-05-15 NOTE — Telephone Encounter (Signed)
I spoke with the pt and he said his refills should have been sent to [facility] not [facility].  I will refill his cardiac medications at this time.

## 2013-05-15 NOTE — Telephone Encounter (Signed)
Pt calling re refills requested since 04-02-13, wants a nurse call

## 2013-06-22 ENCOUNTER — Other Ambulatory Visit: Payer: Self-pay

## 2013-08-17 IMAGING — CR DG CHEST 2V
2 series · 2 of 2 positions shown · non-contrast
Comparison: 09/15/2010

CLINICAL DATA: Preoperative evaluation for laproscopic
cholecystectomy and intraoperative cholangiogram.  Hypertension
treated medically.  Hiatal hernia and diabetes.  Left upper
quadrant pain.  12 year history of smoking

CHEST - 2 VIEW

[view not recorded (1 of 2)]
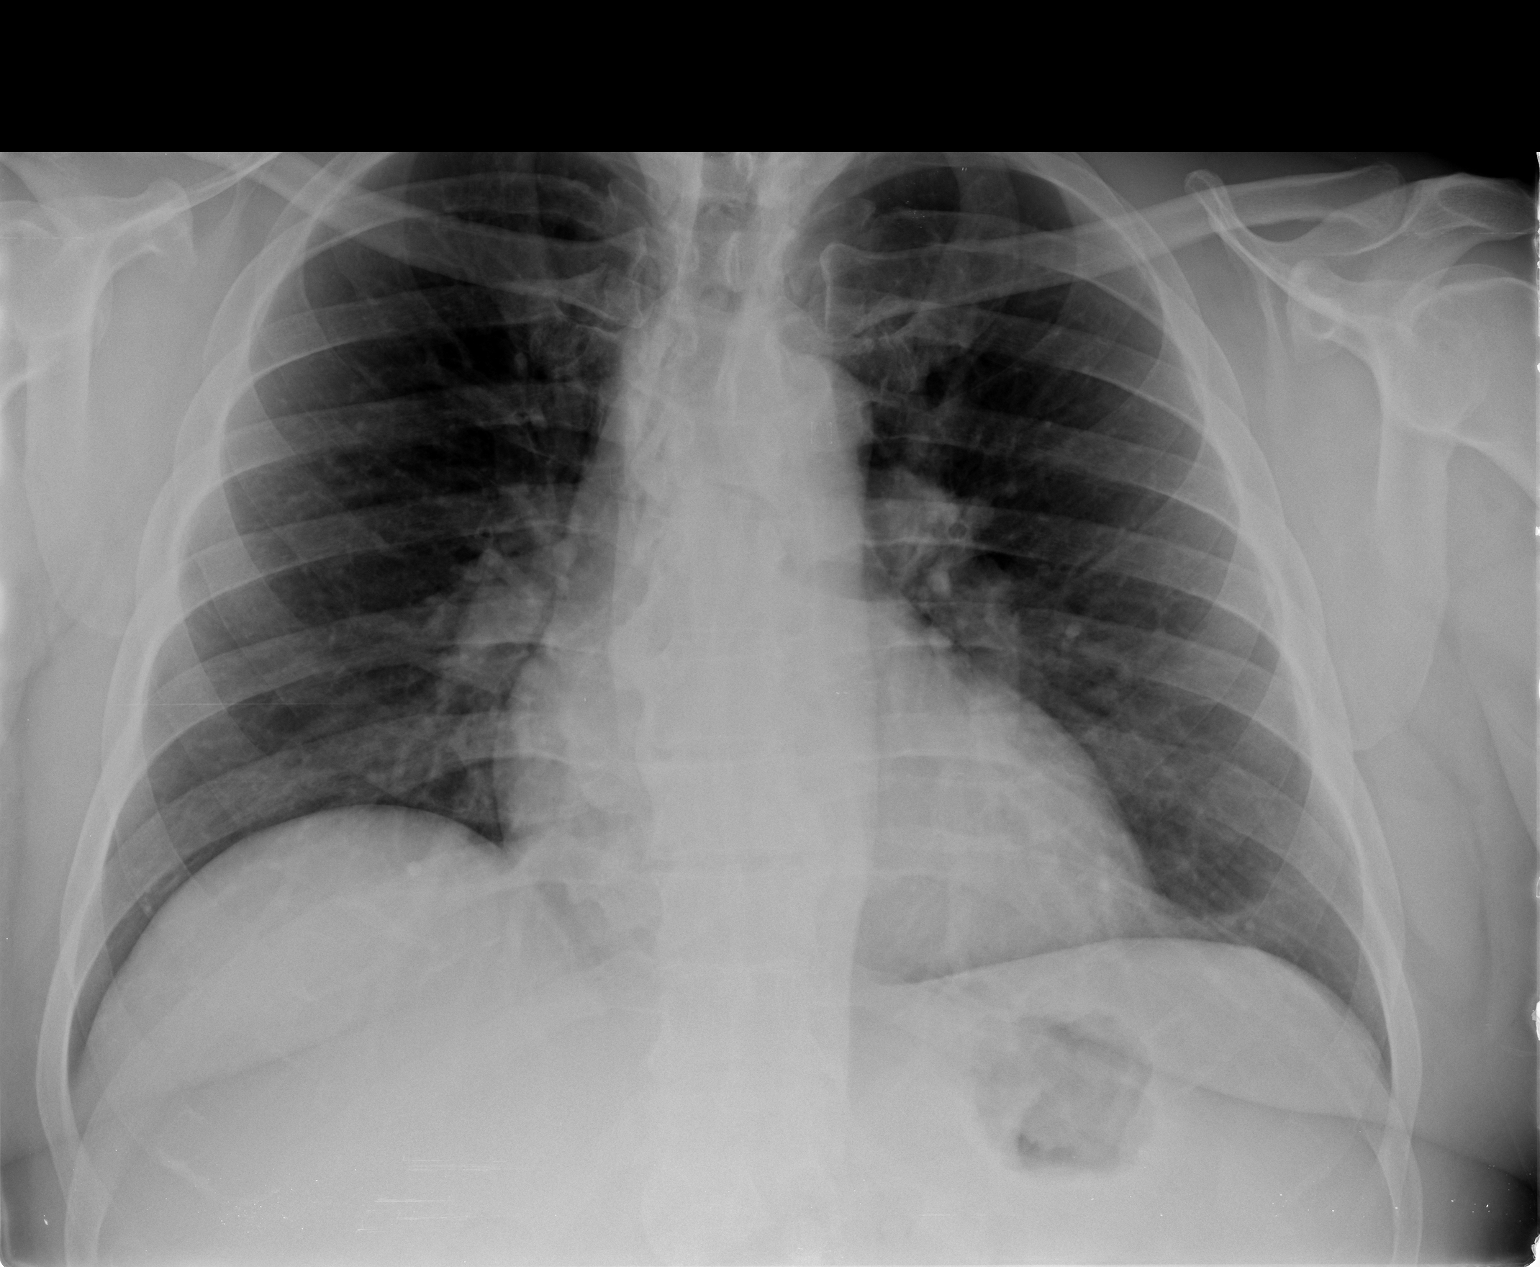

[view not recorded (2 of 2)]
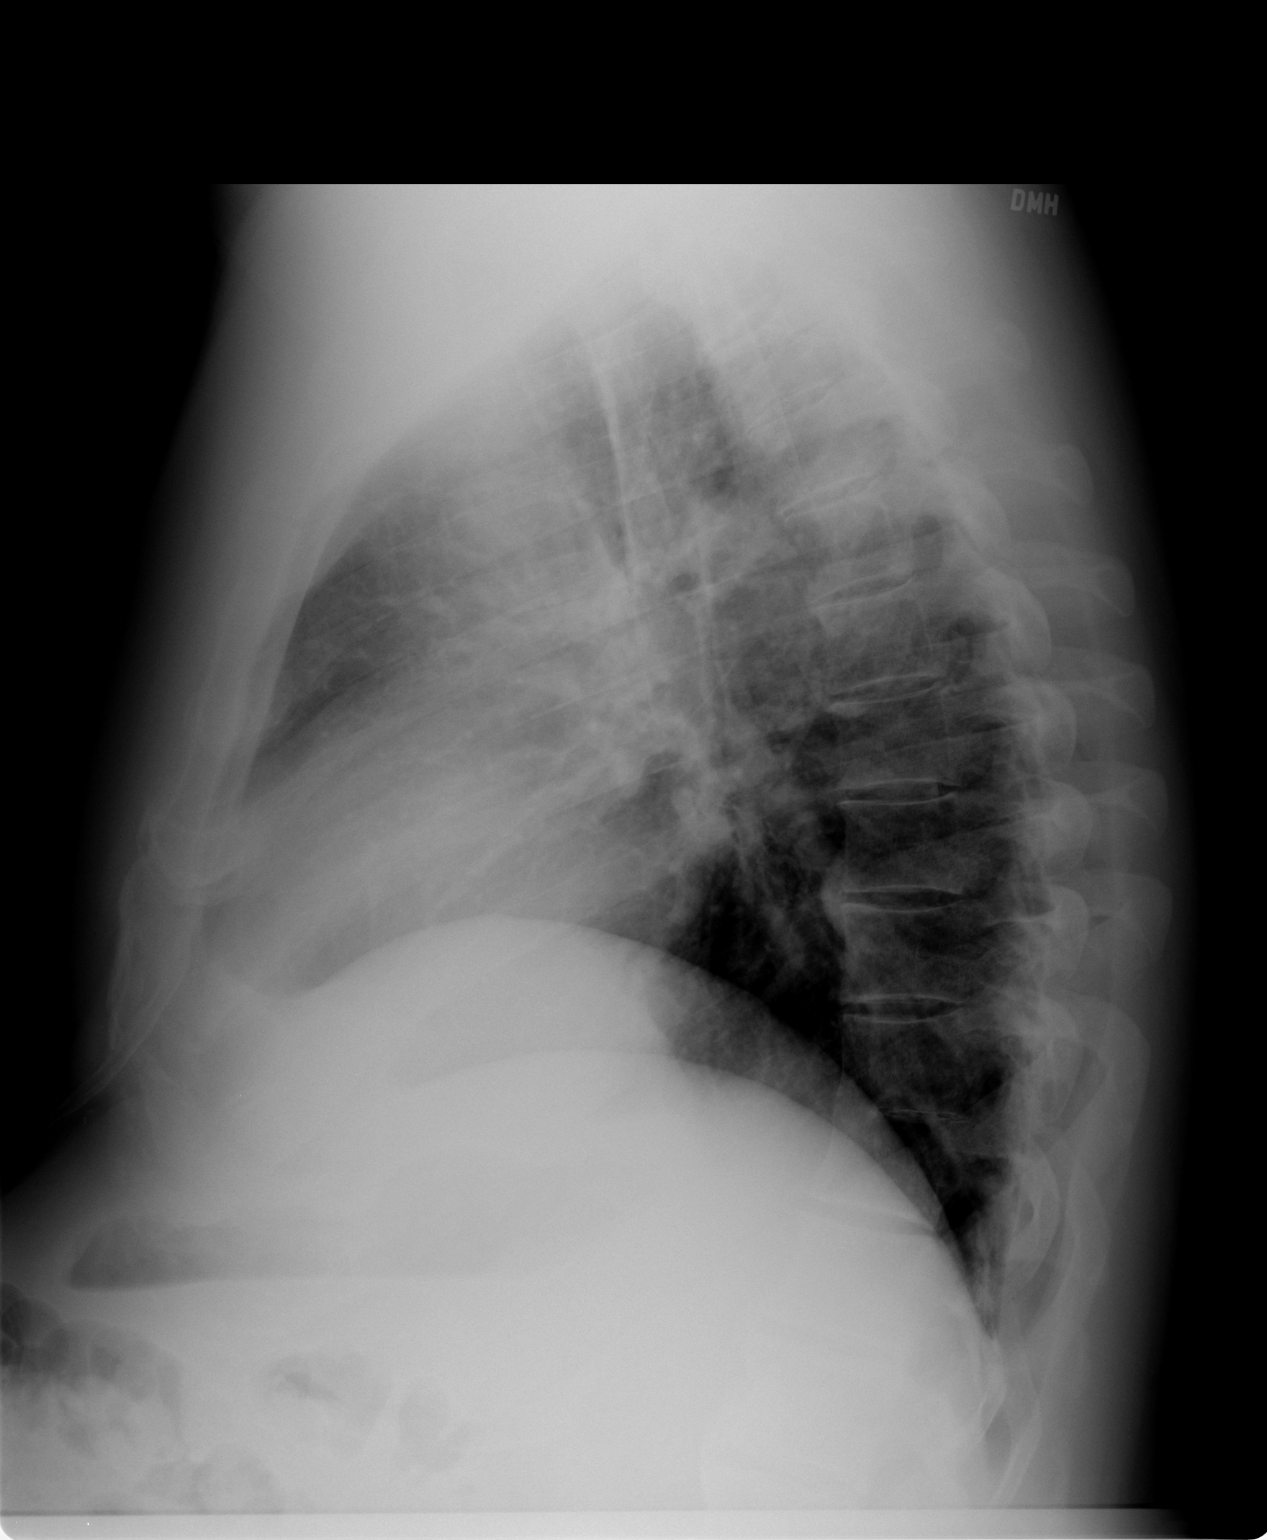

[2 of 2 positions shown; findings below may reference images not displayed]

FINDINGS: Lung volumes are slightly diminished.  Taking this into
consideration heart size is upper normal to mildly enlarged.  The
mediastinal contour is within normal limits and stable.  Lung
fields are clear with no signs of focal infiltrate or congestive
failure.  No pleural fluid or significant peribronchial cuffing is
seen.

Bony structures demonstrate mild degenerative change of the lower
thoracic spine. Punctate radiopaque densities are again noted
overlying the region of the thoracic inlet angle.
IMPRESSION: Stable cardiopulmonary appearance with no new focal or acute
abnormality suggested

## 2013-08-31 IMAGING — RF DG CHOLANGIOGRAM OPERATIVE
1 series · 4 of 4 positions shown · non-contrast
Comparison: none

Intraoperative cholangiogram
HISTORY: Cholelithiasis

[Series 1: run · 4 of 41 frames shown]
[frame 7/41]
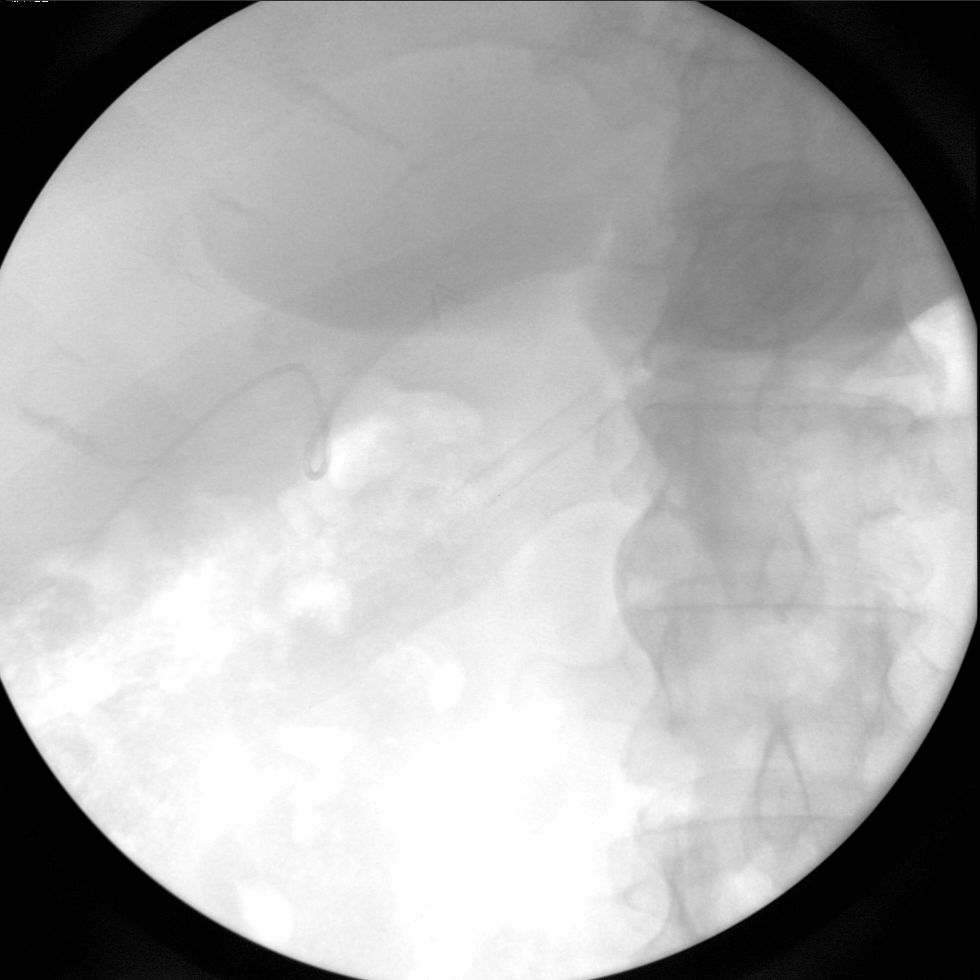
[frame 21/41]
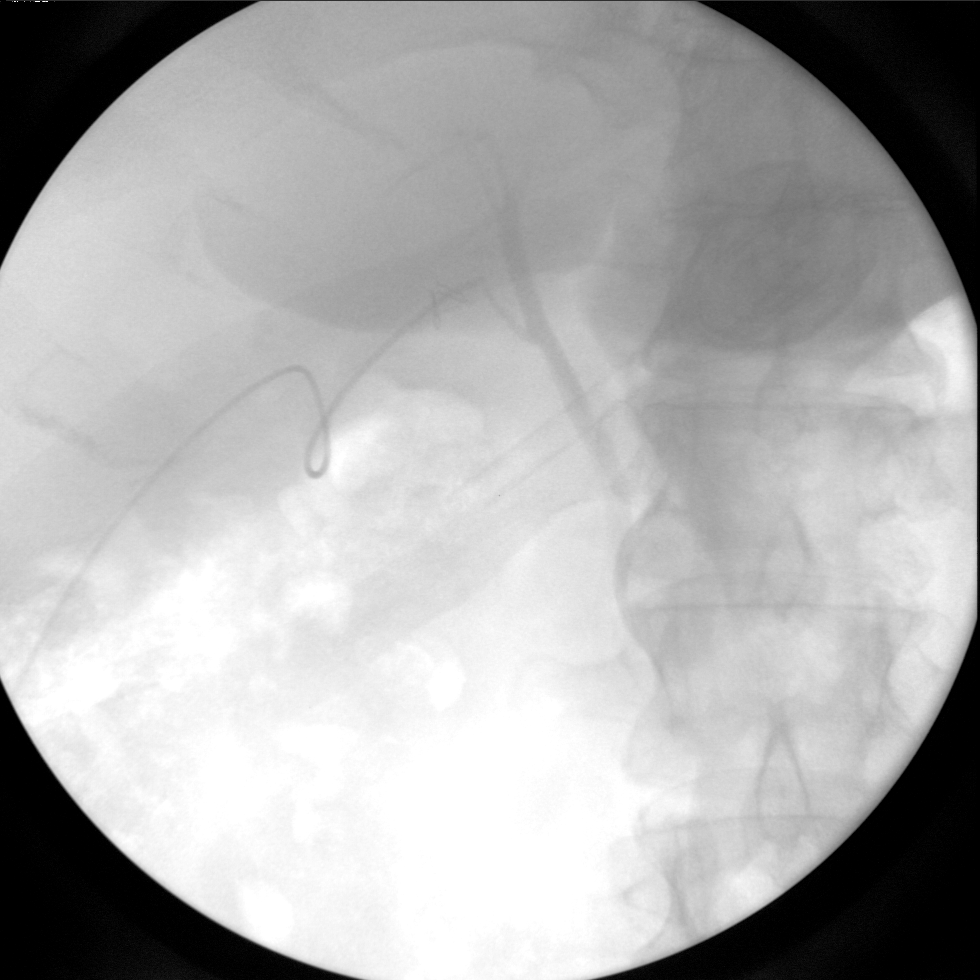
[frame 35/41]
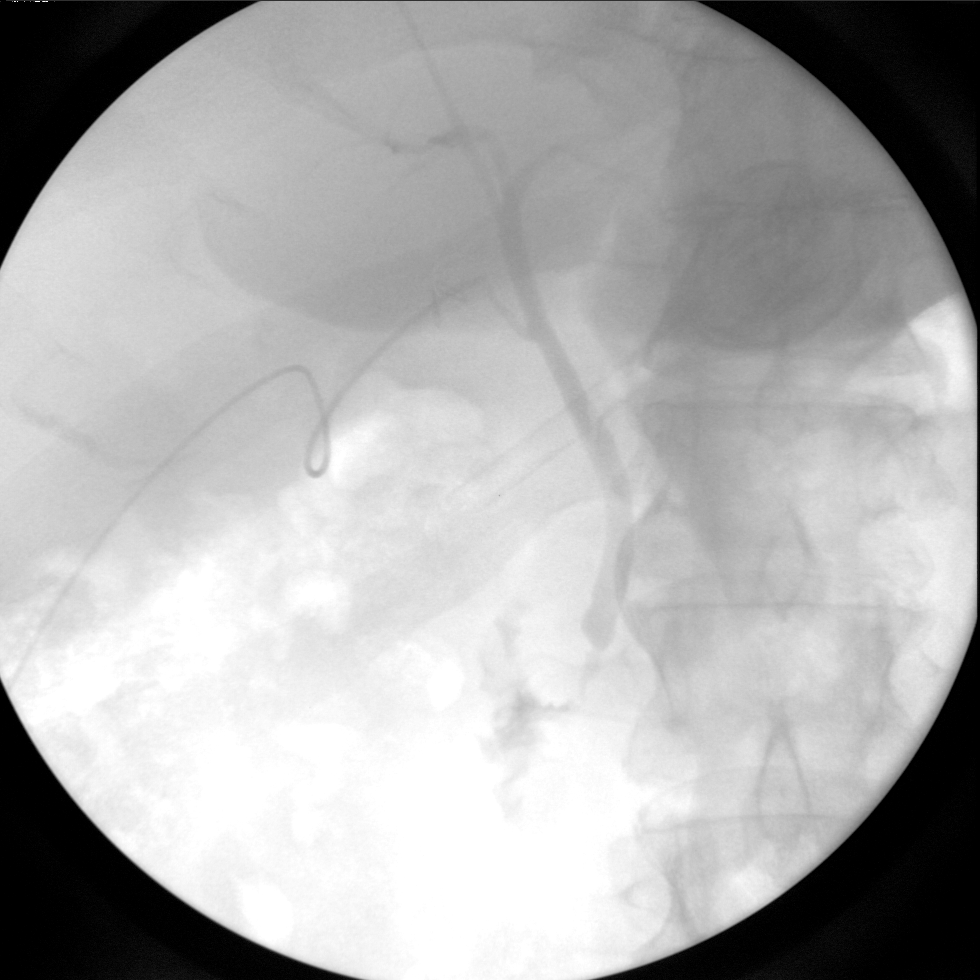
[frame 41/41]
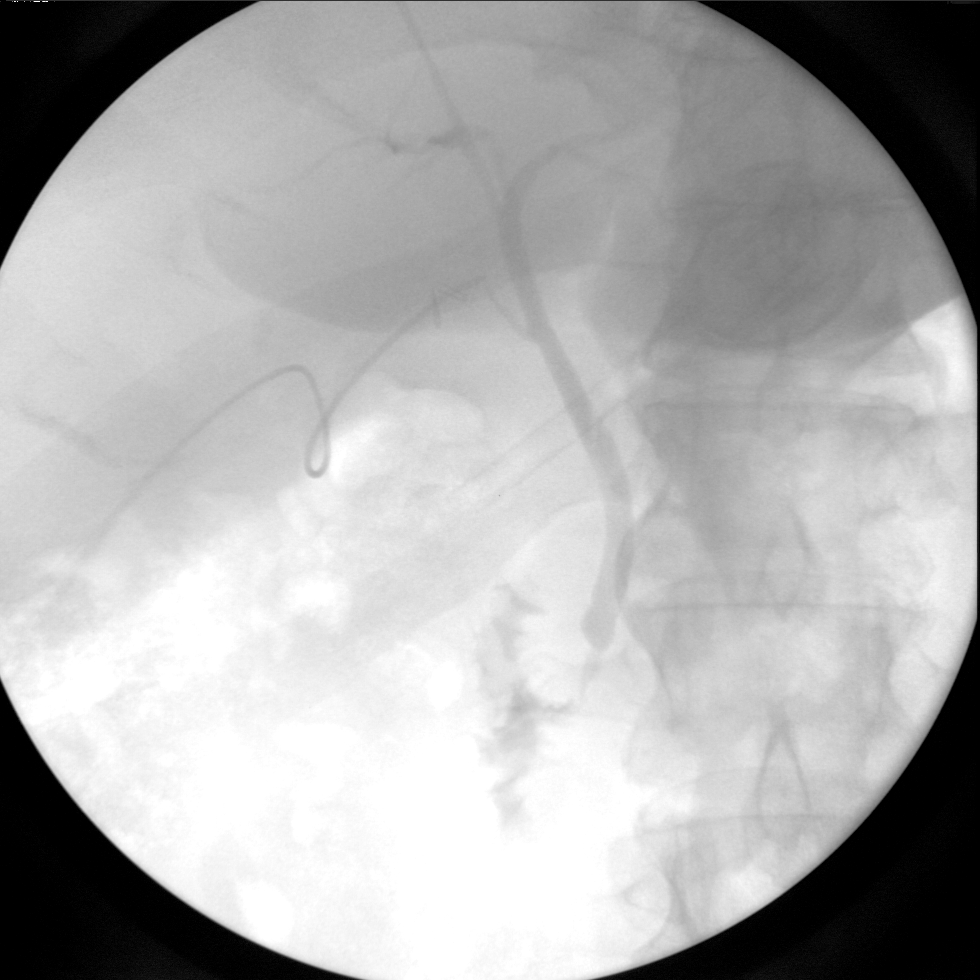

[4 of 4 positions shown; findings below may reference images not displayed]

FINDINGS: Gallbladder is been removed, and cystic duct has been
cannulated.  No mass or calculus is seen in the intrahepatic or
extrahepatic biliary ductal system.  There appears to be some
distal common bile duct spasm.  There is apparent free flow of
contrast via the common bile duct into the duodenum.
CONCLUSION: No obstructing lesions seen.  Apparent spasm in the
distal common bile duct region.

## 2014-01-03 ENCOUNTER — Other Ambulatory Visit: Payer: Self-pay

## 2014-01-03 MED ORDER — POTASSIUM CHLORIDE CRYS ER 20 MEQ PO TBCR: 20.0000 meq | EXTENDED_RELEASE_TABLET | Freq: Two times a day (BID) | ORAL | Status: DC

## 2014-01-03 MED ORDER — MINOXIDIL 10 MG PO TABS: 20.0000 mg | ORAL_TABLET | Freq: Two times a day (BID) | ORAL | Status: DC

## 2014-01-05 ENCOUNTER — Other Ambulatory Visit: Payer: Self-pay

## 2014-01-05 MED ORDER — MINOXIDIL 10 MG PO TABS: 20.0000 mg | ORAL_TABLET | Freq: Two times a day (BID) | ORAL | Status: DC

## 2014-01-05 MED ORDER — POTASSIUM CHLORIDE CRYS ER 20 MEQ PO TBCR: 20.0000 meq | EXTENDED_RELEASE_TABLET | Freq: Two times a day (BID) | ORAL | Status: DC

## 2014-01-11 ENCOUNTER — Other Ambulatory Visit: Payer: Self-pay | Admitting: *Deleted

## 2014-01-11 MED ORDER — MINOXIDIL 10 MG PO TABS: 20.0000 mg | ORAL_TABLET | Freq: Two times a day (BID) | ORAL | Status: DC

## 2014-01-11 MED ORDER — POTASSIUM CHLORIDE CRYS ER 20 MEQ PO TBCR: 20.0000 meq | EXTENDED_RELEASE_TABLET | Freq: Two times a day (BID) | ORAL | Status: DC

## 2014-02-24 ENCOUNTER — Other Ambulatory Visit: Payer: Self-pay | Admitting: Cardiovascular Disease

## 2014-02-28 ENCOUNTER — Other Ambulatory Visit: Payer: Self-pay

## 2014-02-28 ENCOUNTER — Other Ambulatory Visit: Payer: Self-pay | Admitting: *Deleted

## 2014-02-28 MED ORDER — LOSARTAN POTASSIUM-HCTZ 50-12.5 MG PO TABS: ORAL_TABLET | ORAL | Status: DC

## 2014-04-06 ENCOUNTER — Ambulatory Visit: Admitting: Cardiovascular Disease

## 2014-04-24 ENCOUNTER — Ambulatory Visit: Admitting: Cardiovascular Disease

## 2014-05-06 ENCOUNTER — Other Ambulatory Visit: Payer: Self-pay | Admitting: Cardiovascular Disease

## 2014-05-11 ENCOUNTER — Encounter: Payer: Self-pay | Admitting: Cardiovascular Disease

## 2014-05-11 ENCOUNTER — Ambulatory Visit (INDEPENDENT_AMBULATORY_CARE_PROVIDER_SITE_OTHER): Admitting: Cardiovascular Disease

## 2014-05-11 VITALS — BP 140/78 | HR 76 | Ht 69.0 in | Wt 221.8 lb

## 2014-05-11 DIAGNOSIS — I6529 Occlusion and stenosis of unspecified carotid artery: Secondary | ICD-10-CM

## 2014-05-11 DIAGNOSIS — I10 Essential (primary) hypertension: Secondary | ICD-10-CM

## 2014-05-11 MED ORDER — CLONIDINE HCL 0.2 MG PO TABS: ORAL_TABLET | ORAL | Status: DC

## 2014-05-11 NOTE — Patient Instructions (Signed)
Your physician recommends that you continue on your current medications as directed. Please refer to the Current Medication list given to you today.  Your physician has requested that you have a carotid duplex. This test is an ultrasound of the carotid arteries in your neck. It looks at blood flow through these arteries that supply the brain with blood. Allow one hour for this exam. There are no restrictions or special instructions.   Your physician wants you to follow-up in: 1 year with Dr.Cooper You will receive a reminder letter in the mail two months in advance. If you don't receive a letter, please call our office to schedule the follow-up appointment.

## 2014-05-11 NOTE — Progress Notes (Signed)
HPI:   58 year old gentleman presenting for followup evaluation. The patient has coronary artery disease and underwent stenting of the right coronary artery in 2011 after presenting with atypical angina. He has severe hypertension and is on multiple medications and is maintained on multiple antihypertensive medications. He takes clonidine 3 times daily. Overall he is doing relatively well. He has occasional dizziness with postural changes. He has not had presyncope or frank syncope. He denies chest pain, chest pressure, or shortness of breath.  Outpatient Encounter Prescriptions as of 05/11/2014  Medication Sig  . amLODipine-benazepril (LOTREL) 10-40 MG per capsule TAKE 1 CAPSULE EVERY MORNING  . aspirin 81 MG tablet Take 81 mg by mouth daily.  Marland Kitchen atorvastatin (LIPITOR) 20 MG tablet TAKE 1 TABLET EVERY EVENING  . B Complex-C (SUPER B COMPLEX PO) Take 1 tablet by mouth daily.    . Canagliflozin (INVOKANA) 300 MG TABS Take 1 tablet by mouth daily with breakfast.  . cloNIDine (CATAPRES) 0.2 MG tablet TAKE 1 TABLET 6 TIMES DAILY  . Coenzyme Q10 (COQ-10) 400 MG CAPS Take 400 mg by mouth daily.  Marland Kitchen COREG CR 40 MG 24 hr capsule TAKE 1 CAPSULE DAILY  . furosemide (LASIX) 40 MG tablet TAKE 1 TABLET EVERY MORNING  . glyBURIDE-metformin (GLUCOVANCE) 2.5-500 MG per tablet Take 2 tablets by mouth 2 (two) times daily.    . insulin aspart protamine-insulin aspart (NOVOLOG MIX 70/30) (70-30) 100 UNIT/ML injection Inject 20-75 Units into the skin 3 (three) times daily with meals. Sliding scale. Pt will take between 20 -75 units depending on what he eats and sugar levels, i.e. Toast 20 units, i.e. [place] fries 75 units  . levocetirizine (XYZAL) 5 MG tablet Take 5 mg by mouth every evening.  Marland Kitchen losartan-hydrochlorothiazide (HYZAAR) 50-12.5 MG per tablet TAKE 2 TABLETS DAILY AT 12 NOON.  . minoxidil (LONITEN) 10 MG tablet Take 2 tablets (20 mg total) by mouth 2 (two) times daily.  . nitroGLYCERIN (NITROSTAT) 0.4 MG  SL tablet Place 1 tablet (0.4 mg total) under the tongue every 5 (five) minutes as needed. For chest pain  . Omega-3 Fatty Acids (FISH OIL) 1200 MG CAPS Take 1 capsule by mouth daily.   . potassium chloride SA (K-DUR,KLOR-CON) 20 MEQ tablet Take 1 tablet (20 mEq total) by mouth 2 (two) times daily.  . [DISCONTINUED] cloNIDine (CATAPRES) 0.2 MG tablet TAKE 1 TABLET 6 TIMES DAILY  . [DISCONTINUED] HYDROcodone-acetaminophen (NORCO/VICODIN) 5-325 MG per tablet Take 1-2 tablets by mouth every 4 (four) hours as needed for pain.  . [DISCONTINUED] Testosterone 20.25 MG/1.25GM (1.62%) GEL Place 4 application onto the skin every morning.    Allergies  Allergen Reactions  . Darvon [Propoxyphene] Other (See Comments)    Unknown   . Morphine Other (See Comments)    hallucinations  . Nifedipine Other (See Comments)    Increase in Blood Pressure  . Tramadol     Hives and itching  . Amoxicillin Hives and Rash  . Amoxicillin-Pot Clavulanate Hives and Rash    Past Medical History  Diagnosis Date  . CAD (coronary artery disease)     a. LHC 10/11:  LAD 40-50%; CFX 30%; OM2 30%; RCA 70-80% (tx with DES), 50-60% distal/ Promus DES to RCA 06/05/2010 [facility] trial)/ EF 60% on cath;  b.  Myoview 2/12: No ischemia, mild apical thinning, EF 55%;   c.  Echo 4/11: Mild focal basal and mild concentric hypertrophy the septum, EF 29-56%, grade 2 diastolic dysfunction, mild LAE  .  Pancreatitis   . Hyperlipidemia   . Hypertension   . 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 Obesity   . Carotid stenosis 02/2007    a.  0-39% bilateral; b. dopplers 05/2010: 74-45% RICA; 1-46% LICA;  c. dopplers 0/4799:  40-59% bilat ICA (repeat 1 year)  . Bell's palsy 07/2009  . Insomnia   . PTSD (post-traumatic stress disorder)   . Dyspnea 05/2008    Myoview; TM/myoview 63% no ischemia/ scar  . Diabetes mellitus     SINCE 2004    ROS: Negative except as per HPI  BP  140/78  Pulse 76  Ht _0  (1.753 m)  Wt 221 lb 12.8 oz (100.608 kg)  BMI 32.74 kg/m2  PHYSICAL EXAM: Pt is alert and oriented, NAD HEENT: normal Neck: JVP - normal, carotids 2+= without bruits Lungs: CTA bilaterally CV: RRR without murmur or gallop Abd: soft, NT, Positive BS, no hepatomegaly Ext: no C/C/E, distal pulses intact and equal Skin: warm/dry no rash  EKG:  Normal sinus rhythm 76 beats per minute, nonspecific IVCD.  ASSESSMENT AND PLAN: 1. Coronary artery disease, native vessel. The patient has no anginal symptoms. He will continue his medical program which includes aspirin, a statin drug, and multiple antihypertensives.   2. Malignant hypertension. He's on a combination of amlodipine, benazepril, carvedilol, clonidine, losartan, hydrochlorothiazide, and minoxidil. He will continue his same program. He reports a very good response to clonidine. He measures his blood pressure frequently at home.  3. Hyperlipidemia. Lipids followed by Dr. Brigitte Pulse. Maintained on a statin drug.  4. Carotid stenosis without history of stroke or TIA. Last carotid duplex scan about 18 months ago showed 40-59% bilateral ICA stenosis.  He is due for a repeat carotid duplex scan.   I'll see him back in one year for follow-up unless any problems arise.  Sherren Mocha 05/11/2014 2:36 PM

## 2014-07-09 ENCOUNTER — Ambulatory Visit (HOSPITAL_COMMUNITY): Attending: Cardiology | Admitting: *Deleted

## 2014-07-09 DIAGNOSIS — E785 Hyperlipidemia, unspecified: Secondary | ICD-10-CM | POA: Diagnosis not present

## 2014-07-09 DIAGNOSIS — I6523 Occlusion and stenosis of bilateral carotid arteries: Secondary | ICD-10-CM

## 2014-07-09 DIAGNOSIS — I10 Essential (primary) hypertension: Secondary | ICD-10-CM | POA: Diagnosis not present

## 2014-07-09 DIAGNOSIS — I2510 Atherosclerotic heart disease of native coronary artery without angina pectoris: Secondary | ICD-10-CM | POA: Diagnosis present

## 2014-07-09 DIAGNOSIS — E119 Type 2 diabetes mellitus without complications: Secondary | ICD-10-CM | POA: Diagnosis not present

## 2014-07-09 DIAGNOSIS — Z87891 Personal history of nicotine dependence: Secondary | ICD-10-CM | POA: Diagnosis not present

## 2014-07-09 NOTE — Progress Notes (Signed)
Carotid duplex completed

## 2014-07-18 ENCOUNTER — Other Ambulatory Visit: Payer: Self-pay | Admitting: Cardiovascular Disease

## 2014-07-23 ENCOUNTER — Other Ambulatory Visit: Payer: Self-pay

## 2014-07-23 MED ORDER — NITROGLYCERIN 0.4 MG SL SUBL: 0.4000 mg | SUBLINGUAL_TABLET | SUBLINGUAL | Status: DC | PRN

## 2014-07-26 ENCOUNTER — Other Ambulatory Visit: Payer: Self-pay | Admitting: Cardiovascular Disease

## 2014-08-07 ENCOUNTER — Other Ambulatory Visit: Payer: Self-pay

## 2014-08-07 MED ORDER — NITROGLYCERIN 0.4 MG SL SUBL: 0.4000 mg | SUBLINGUAL_TABLET | SUBLINGUAL | Status: DC | PRN

## 2014-10-18 ENCOUNTER — Other Ambulatory Visit: Payer: Self-pay | Admitting: Cardiovascular Disease

## 2014-11-06 ENCOUNTER — Other Ambulatory Visit: Payer: Self-pay | Admitting: *Deleted

## 2014-11-06 MED ORDER — POTASSIUM CHLORIDE CRYS ER 20 MEQ PO TBCR: 20.0000 meq | EXTENDED_RELEASE_TABLET | Freq: Two times a day (BID) | ORAL | Status: DC

## 2014-11-19 ENCOUNTER — Telehealth: Payer: Self-pay | Admitting: Cardiovascular Disease

## 2014-11-19 NOTE — Telephone Encounter (Signed)
Spoke with pt. He reports blood pressure has been elevated recently especially during night. Takes extra clonidine when up.  Readings will then be low in the AM.  Would like appt. Pt does not feel he needs to be seen today.  Wants to see Dr. Burt Knack.  Appt made for pt to see Dr. Burt Knack on November 28, 2014 at 12:15

## 2014-11-19 NOTE — Telephone Encounter (Signed)
Left message to call back

## 2014-11-19 NOTE — Telephone Encounter (Signed)
Noted. thx

## 2014-11-19 NOTE — Telephone Encounter (Signed)
New Message  Pt calling about fluctuations in BP. Pt wants to be seen. Per pt BP can go from:170/100 to 90/45. Please call back and dsicuss.

## 2014-11-27 ENCOUNTER — Other Ambulatory Visit: Payer: Self-pay | Admitting: Cardiovascular Disease

## 2014-11-28 ENCOUNTER — Encounter: Payer: Self-pay | Admitting: Cardiovascular Disease

## 2014-11-28 ENCOUNTER — Ambulatory Visit (INDEPENDENT_AMBULATORY_CARE_PROVIDER_SITE_OTHER): Admitting: Cardiovascular Disease

## 2014-11-28 VITALS — BP 162/92 | HR 79 | Ht 69.0 in | Wt 218.4 lb

## 2014-11-28 DIAGNOSIS — E785 Hyperlipidemia, unspecified: Secondary | ICD-10-CM | POA: Diagnosis not present

## 2014-11-28 DIAGNOSIS — I10 Essential (primary) hypertension: Secondary | ICD-10-CM | POA: Diagnosis not present

## 2014-11-28 MED ORDER — SPIRONOLACTONE 25 MG PO TABS: 25.0000 mg | ORAL_TABLET | Freq: Every day | ORAL | Status: DC

## 2014-11-28 MED ORDER — POTASSIUM CHLORIDE CRYS ER 20 MEQ PO TBCR: 20.0000 meq | EXTENDED_RELEASE_TABLET | Freq: Every day | ORAL | Status: DC

## 2014-11-28 NOTE — Patient Instructions (Addendum)
Medication Instructions:  Your physician has recommended you make the following change in your medication:  1. START Spironolactone 76m take one by mouth daily 2. DECREASE Potassium Chloride to 265m take one by mouth daily  Labwork: Your physician recommends that you return for lab work: 2 WEEKS (BMP) after making medication changes, lab hours 7:30 AM-5:00 PM. Please calle the office at [phone] to schedule lab appointment.   Testing/Procedures: No new orders.  Follow-Up: Your physician wants you to follow-up in: 6 MONTHS with Dr CoBurt Knack You will receive a reminder letter in the mail two months in advance. If you don't receive a letter, please call our office to schedule the follow-up appointment.  Any Other Special Instructions Will Be Listed Below (If Applicable).

## 2014-11-28 NOTE — Progress Notes (Signed)
Cardiology Office Note   Date:  11/28/2014   ID:  EUTIMIO GHARIBIAN, DOB 07/18/1956, MRN [id number]  PCP:  Marton Redwood, MD  Cardiologist:  Sherren Mocha, MD    Chief Complaint  Patient presents with  . Hypertension    ELEVATED BP     History of Present Illness: Austin [name] is a 59 y.o. male who presents for follow-up of coronary artery disease. The patient has coronary artery disease and underwent stenting of the right coronary artery in 2011 after presenting with atypical angina. He has severe hypertension and is on multiple medications and is maintained on multiple antihypertensive medications.  He continues to have problems with blood pressure control. He wakes up at times with headaches. Notes that his blood pressure is often times in the range of 160/90 occurs. Patient takes clonidine 5 or 6 times every day. Blood pressure improves generally within 15 minutes of taking clonidine. He is compliant with all of his other antihypertensive medications. He is limited by problems with his neck and back. He denies chest pain or exertional dyspnea. He does wake up at times with shortness of breath.   Past Medical History  Diagnosis Date  . CAD (coronary artery disease)     a. LHC 10/11:  LAD 40-50%; CFX 30%; OM2 30%; RCA 70-80% (tx with DES), 50-60% distal/ Promus DES to RCA 06/05/2010 [facility] trial)/ EF 60% on cath;  b.  Myoview 2/12: No ischemia, mild apical thinning, EF 55%;   c.  Echo 4/11: Mild focal basal and mild concentric hypertrophy the septum, EF 00-37%, grade 2 diastolic dysfunction, mild LAE  . Pancreatitis   . Hyperlipidemia   . Hypertension   . 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 Obesity   . Carotid stenosis 02/2007    a.  0-39% bilateral; b. dopplers 05/2010: 04-88% RICA; 8-91% LICA;  c. dopplers 01/9449:  40-59% bilat ICA (repeat 1 year)  . Bell's palsy 07/2009  . Insomnia   . PTSD (post-traumatic  stress disorder)   . Dyspnea 05/2008    Myoview; TM/myoview 63% no ischemia/ scar  . Diabetes mellitus     SINCE 2004    Past Surgical History  Procedure Laterality Date  . Cardiac catheterization      LAD 40-50%; CFX 30%; OM2 30%; RCA 70-80% (tx with DES), 50-60% distal  . Lumbar disc surgery      C5-6  . Appendectomy    . Tonsillectomy    . Cervical spine surgery      2006  . Cholecystectomy  09/16/2012    Procedure: LAPAROSCOPIC CHOLECYSTECTOMY WITH INTRAOPERATIVE CHOLANGIOGRAM;  Surgeon: Odis Hollingshead, MD;  Location: [place];  Service: General;  Laterality: N/A;    Current Outpatient Prescriptions  Medication Sig Dispense Refill  . amLODipine-benazepril (LOTREL) 10-40 MG per capsule Take 1 capsule by mouth daily. 90 capsule 1  . aspirin 81 MG tablet Take 81 mg by mouth daily.    Marland Kitchen atorvastatin (LIPITOR) 20 MG tablet Take 1 tablet (20 mg total) by mouth daily at 6 PM. 90 tablet 1  . B Complex-C (SUPER B COMPLEX PO) Take 1 tablet by mouth daily.      . Canagliflozin (INVOKANA) 300 MG TABS Take 1 tablet by mouth daily with breakfast.    . carvedilol (COREG CR) 40 MG 24 hr capsule Take 1 capsule (40 mg total) by mouth daily. 90 capsule 1  . cloNIDine (CATAPRES) 0.2  MG tablet Take 1 tablet 6 times daily 540 tablet 1  . Coenzyme Q10 (COQ-10) 400 MG CAPS Take 400 mg by mouth daily.    . furosemide (LASIX) 40 MG tablet Take 1 tablet (40 mg total) by mouth daily. 90 tablet 1  . glyBURIDE-metformin (GLUCOVANCE) 2.5-500 MG per tablet Take 2 tablets by mouth 2 (two) times daily.      . insulin aspart protamine-insulin aspart (NOVOLOG MIX 70/30) (70-30) 100 UNIT/ML injection Inject 20-75 Units into the skin 3 (three) times daily with meals. Sliding scale. Pt will take between 20 -75 units depending on what he eats and sugar levels, i.e. Toast 20 units, i.e. [place] fries 75 units    . levocetirizine (XYZAL) 5 MG tablet Take 5 mg by mouth every evening.    Marland Kitchen losartan-hydrochlorothiazide  (HYZAAR) 50-12.5 MG per tablet TAKE 2 TABLETS DAILY AT 12 NOON 180 tablet 1  . minoxidil (LONITEN) 10 MG tablet TAKE 2 TABLETS (20 MG TOTAL) TWO TIMES DAILY 360 tablet 2  . nitroGLYCERIN (NITROSTAT) 0.4 MG SL tablet Place 1 tablet (0.4 mg total) under the tongue every 5 (five) minutes as needed. For chest pain 25 tablet 3  . Omega-3 Fatty Acids (FISH OIL) 1200 MG CAPS Take 1 capsule by mouth daily.     . potassium chloride SA (K-DUR,KLOR-CON) 20 MEQ tablet Take 1 tablet (20 mEq total) by mouth daily. 90 tablet 3  . spironolactone (ALDACTONE) 25 MG tablet Take 1 tablet (25 mg total) by mouth daily. 90 tablet 3   No current facility-administered medications for this visit.   Allergies:   Morphine; Nifedipine; Darvon; Tramadol; Amoxicillin; and Amoxicillin-pot clavulanate   Social History:  The patient  reports that he has quit smoking. He does not have any smokeless tobacco history on file. He reports that he drinks alcohol. He reports that he does not use illicit drugs.   Family History:  The patient's  family history includes Emphysema in his mother; Hypertension in his mother and other; Stroke in his brother.   ROS:  Please see the history of present illness.  Otherwise, review of systems is positive for back and neck pain.  All other systems are reviewed and negative.   PHYSICAL EXAM: VS:  BP 162/92 mmHg  Pulse 79  Ht _0  (1.753 m)  Wt 218 lb 6.4 oz (99.066 kg)  BMI 32.24 kg/m2 , BMI Body mass index is 32.24 kg/(m^2). GEN: Well nourished, well developed, in no acute distress HEENT: normal Neck: no JVD, no masses. No carotid bruits Cardiac: RRR without murmur or gallop                Respiratory:  clear to auscultation bilaterally, normal work of breathing GI: soft, nontender, nondistended, + BS MS: no deformity or atrophy Ext: no pretibial edema, pedal pulses 2+= bilaterally Skin: warm and dry, no rash Neuro:  Strength and sensation are intact Psych: euthymic mood, full  affect  EKG:  EKG is ordered today. The ekg ordered today shows normal sinus rhythm 79 bpm, mildly prolonged QT interval with corrected QT of 467 ms  Recent Labs: No results found for requested labs within last 365 days.   Lipid Panel     Component Value Date/Time   CHOL 111 09/19/2007 0850   TRIG 102 09/19/2007 0850   HDL 29.3* 09/19/2007 0850   CHOLHDL 3.8 CALC 09/19/2007 0850   VLDL 20 09/19/2007 0850   LDLCALC 61 09/19/2007 0850      Wt Readings  from Last 3 Encounters:  11/28/14 218 lb 6.4 oz (99.066 kg)  05/11/14 221 lb 12.8 oz (100.608 kg)  12/23/12 227 lb (102.967 kg)     Cardiac Studies Reviewed: Carotid Duplex: 40-59% RICA stenosis, < 84% LICA stenosis.   ASSESSMENT AND PLAN: 1.  Malignant hypertension, uncontrolled. He continues on multidrug therapy as outlined above. Clonidine works well for him, but he is having to take it often. In review of his medications, it may be helpful to add Spirinolactone. I have found this at times to be quite helpful in patients who are failing and otherwise aggressive antihypertensive regimen as he is already on. I asked him to reduce his K-Dur to once daily. Will arrange a metabolic panel in 2 weeks. He otherwise will continue his current medications without changes. He had labs drawn last week and we will request these.  2. CAD, native vessel: No anginal symptoms. Continue current medical program.  3. Hyperlipidemia: The patient continues on atorvastatin. His lipids are followed by Dr. Brigitte Pulse.  4. Carotid stenosis without history of stroke or TIA. I reviewed his most recent carotid duplex scan as outlined above. He has mild to moderate carotid stenosis with 40-59% right ICA stenosis and no significant obstruction on the left. He should have yearly follow-up.  Current medicines are reviewed with the patient today.  The patient does not have concerns regarding medicines.  The following changes have been made:    Add spironolactone 25  mg daily  Reduce K-Dur 20 milliequivalents once daily  Labs/ tests ordered today include:   Orders Placed This Encounter  Procedures  . Basic Metabolic Panel (BMET)  . EKG 12-Lead   Disposition:   FU 6 months  Signed, Sherren Mocha, MD  11/28/2014 1:52 PM    [facility] [address], [place], [place]  [address] Phone: [phone]; Fax: [phone]

## 2014-12-10 ENCOUNTER — Encounter: Payer: Self-pay | Admitting: Cardiovascular Disease

## 2014-12-11 ENCOUNTER — Telehealth: Payer: Self-pay | Admitting: Cardiovascular Disease

## 2014-12-11 NOTE — Telephone Encounter (Signed)
Pt c/o medication issue:  1. Name of Medication: Spironolactone  2. How are you currently taking this medication (dosage and times per day)?  25 mg  1x per day   3. Are you having a reaction (difficulty breathing--STAT)? No  4. What is your medication issue? Pt calling stating that since starting this medication last Friday he has been nauseous, had sever heartburn, and a headache that lasts all day long. Please call back and advise.

## 2014-12-11 NOTE — Telephone Encounter (Signed)
I left the pt a message to hold spironolactone at this time and see if his symptoms improve.

## 2014-12-17 ENCOUNTER — Telehealth: Payer: Self-pay | Admitting: Cardiovascular Disease

## 2014-12-17 DIAGNOSIS — E785 Hyperlipidemia, unspecified: Secondary | ICD-10-CM

## 2014-12-17 DIAGNOSIS — I10 Essential (primary) hypertension: Secondary | ICD-10-CM

## 2014-12-17 MED ORDER — POTASSIUM CHLORIDE CRYS ER 20 MEQ PO TBCR: 20.0000 meq | EXTENDED_RELEASE_TABLET | Freq: Two times a day (BID) | ORAL | Status: DC

## 2014-12-17 NOTE — Telephone Encounter (Signed)
Please see follow-up phone call from 12/17/14.

## 2014-12-17 NOTE — Telephone Encounter (Signed)
Would go back to previous medications. I really don't have any other ideas as he is on maximal doses of the hypertensive medicines.

## 2014-12-17 NOTE — Telephone Encounter (Signed)
I left a detailed message on the pt's voicemail that we are discontinuing spironolactone, resume potassium one tablet twice a day and cancel appointment for BP check and lab work. Advised to call the office with any other questions.

## 2014-12-17 NOTE — Telephone Encounter (Signed)
New message         Heart burn and headache went away after coming off of medication   Do I still need my appt with the nurse on Friday   Do i need to restart potassium twice a day

## 2014-12-17 NOTE — Telephone Encounter (Signed)
12/11/14 the pt was advised to hold spironolactone and see if symptoms improved. I will forward message to Dr Burt Knack to review.

## 2014-12-21 ENCOUNTER — Other Ambulatory Visit

## 2014-12-21 ENCOUNTER — Encounter

## 2014-12-26 ENCOUNTER — Other Ambulatory Visit: Payer: Self-pay | Admitting: Cardiovascular Disease

## 2015-01-01 ENCOUNTER — Telehealth: Payer: Self-pay | Admitting: Cardiovascular Disease

## 2015-01-01 NOTE — Telephone Encounter (Signed)
New Message        Pt calling stating that he sent a message through MyChart to [place] on 12/27/14 and would like a response. Pt will not disclose details. Please call back and advise.

## 2015-01-01 NOTE — Telephone Encounter (Signed)
I will forward this message to Dr Burt Knack to review with the pt's My Chart message. Dr Burt Knack and I have been out off the office since 12/26/14.   Austin [name],  Not sure I can wait till whenever my next appt is with Cr Burt Knack due to increased blood pressure being somewhat debilitating, (ie headaches, some exertional SOB, perhaps episodic exertional & emotional angina (questionable) diminshed with Nitro), to me while working and home life as well. I understand we've almost exhausted most therapeutic means out there but maybe he can pull something out of his black bag of cures. I should probably start getting better documentation on my blood pressure so [facility] can [facility] medications.   Respectfully,   Austin [name]

## 2015-01-03 ENCOUNTER — Other Ambulatory Visit: Payer: Self-pay | Admitting: Cardiovascular Disease

## 2015-01-04 NOTE — Telephone Encounter (Signed)
Message sent to the pt through My Chart: Austin [name],    Dr Burt Knack reviewed your chart and he recommended that you be followed in our Hypertension Clinic for BP management.  This clinic is ran by a pharmacist and they will see you and adjust your medications. This clinic is for patient's who have very difficult to manage BP and have multiple medication intolerances.  If this is something that you would like to try you can send me a message and I will have a scheduler contact you with an appointment.  Himmat Enberg RN

## 2015-01-04 NOTE — Telephone Encounter (Signed)
The pt responded through My Chart and he will contact the office if he decides he would like to make an appointment with the hypertension clinic.

## 2015-01-15 ENCOUNTER — Encounter: Payer: Self-pay | Admitting: Cardiovascular Disease

## 2015-01-18 ENCOUNTER — Ambulatory Visit (INDEPENDENT_AMBULATORY_CARE_PROVIDER_SITE_OTHER): Admitting: Pharmacist

## 2015-01-18 VITALS — BP 168/88

## 2015-01-18 DIAGNOSIS — I10 Essential (primary) hypertension: Secondary | ICD-10-CM

## 2015-01-18 NOTE — Progress Notes (Signed)
Cardiology Office Note   Date:  01/18/2015   ID:  Austin [name], DOB [birth year], MRN [id number]  PCP:  Marton Redwood, MD  Cardiologist:  Sherren Mocha, MD   Chief Complaint  Patient presents with  . Hypertension     History of Present Illness: Austin [name] is a 59 y.o. male who was referred by Dr. Burt Knack for management of his hypertension.  He has a PMH significant for CAD s/p PCI to the RCA in 2011, HTN, DM.  Pt has many problems with his BP. He wakes up at times with headaches and checks his BP.  He will then take up to 0.20m of clonidine during the night to improve his BP and headache.  He recently wore a pulse ox during the night and recorded >300 desaturations during that time.  He now has O2 he wears through a nasal canula but states it doesn't always stay in place.  He states he is compliant with all of his medications.  He was in a previous study for renal artery ablation but was not a candidate because he did not have enough renal artery stenosis.  At his last visit with Dr. CBurt Knack he was given an Rx for spironolactone but stopped this due to GI upset.    Current BP Medications (as taken throughout day):  Morning:  Minoxidil 185m Benazepril/amlodipine 40/1035mFurosemide 35m57mlonidine 0.2mg 36munch:  Clonidine 0.2mg  67martan/HCTZ 100/25mg  44mtime:  Clonidine 0.2mg  Mi63midil 10mg  Co72mCR 35mg   PR33mClonidine 0.2mg (up to33m8mg extra/d60m  Intolerance: spironolactone (GI upset), clonidine patch (skin irritation)   Diet/Exercise Pt states he tries to watch his diet.  He limites all salt.  [place] [address] [place]  It takes him ~50 minutes to walk the loop.      Current Outpatient Prescriptions  Medication Sig Dispense Refill  . amLODipine-benazepril (LOTREL) 10-40 MG per capsule TAKE 1 CAPSULE DAILY 90 capsule 1  . aspirin 81 MG tablet Take 81 mg by mouth daily.    . atorvastatMarland Kitchenn (LIPITOR) 20 MG tablet TAKE 1 TABLET  (20 MG TOTAL) DAILY AT 6 P.M. 90 tablet 1  . B Complex-C (SUPER B COMPLEX PO) Take 1 tablet by mouth daily.      . Canagliflozin (INVOKANA) 300 MG TABS Take 1 tablet by mouth daily with breakfast.    . cloNIDine (CATAPRES) 0.2 MG tablet TAKE 1 TABLET 6 TIMES DAILY 540 tablet 1  . Coenzyme Q10 (COQ-10) 400 MG CAPS Take 400 mg by mouth daily.    . COREG CR 4Marland Kitchen MG 24 hr capsule TAKE 1 CAPSULE DAILY 90 capsule 1  . furosemide (LASIX) 40 MG tablet TAKE 1 TABLET DAILY 90 tablet 1  . glyBURIDE-metformin (GLUCOVANCE) 2.5-500 MG per tablet Take 2 tablets by mouth 2 (two) times daily.      . insulin aspart protamine-insulin aspart (NOVOLOG MIX 70/30) (70-30) 100 UNIT/ML injection Inject 20-75 Units into the skin 3 (three) times daily with meals. Sliding scale. Pt will take between 20 -75 units depending on what he eats and sugar levels, i.e. Toast 20 units, i.e. French fries[place]its    . levocetirizine (XYZAL) 5 MG tablet Take 5 mg by mouth every evening.    . losartan-hMarland Kitchendrochlorothiazide (HYZAAR) 50-12.5 MG per tablet TAKE 2 TABLETS DAILY AT 12 NOON 180 tablet 1  . minoxidil (LONITEN) 10 MG tablet TAKE 2 TABLETS (20  MG TOTAL) TWO TIMES DAILY 360 tablet 2  . nitroGLYCERIN (NITROSTAT) 0.4 MG SL tablet Place 1 tablet (0.4 mg total) under the tongue every 5 (five) minutes as needed. For chest pain 25 tablet 3  . Omega-3 Fatty Acids (FISH OIL) 1200 MG CAPS Take 1 capsule by mouth daily.     . potassium chloride SA (K-DUR,KLOR-CON) 20 MEQ tablet Take 1 tablet (20 mEq total) by mouth 2 (two) times daily. 90 tablet 3   No current facility-administered medications for this visit.   Allergies:   Morphine; Nifedipine; Darvon; Tramadol; Amoxicillin; and Amoxicillin-pot clavulanate   Social History:  The patient  reports that he has quit smoking. He does not have any smokeless tobacco history on file. He reports that he drinks alcohol. He reports that he does not use illicit drugs.   Family History:  The patient's   family history includes Emphysema in his mother; Hypertension in his mother and other; Stroke in his brother.   RASSESSMENT AND PLAN: 1.  Malignant hypertension, uncontrolled. - Pt is currently taking 8 different classes of hypertensive medications.  He states that he is compliant with current therapy.  Before any changes in medications are made, suggested pt get an ambulatory BP monitor.  This will help [place] determine what his BP looks like during the day and night.  Once results are back, will consider stopping ACE-I/ARB combo as there is little data that this is helpful and may just increase risk of ARI.  Losartan also has less BP lowering effects than other ARBs.  Consider d/c benazepril and losartan and changing to [place].  Once off ACE-I/ARB, would like to try aldosterone antagonist again with epleronone.  Pt wants to wear BP monitor over a weekend so will try to get placed next week and adjust meds after results are available.      Cyndee Brightly [facility]  01/18/2015 4:45 PM    [facility] [address], [place],   [address] Phone: [phone]; Fax: [phone]

## 2015-01-22 ENCOUNTER — Ambulatory Visit (INDEPENDENT_AMBULATORY_CARE_PROVIDER_SITE_OTHER)
Admission: RE | Admit: 2015-01-22 | Discharge: 2015-01-22 | Disposition: A | Discharge status: Home / Self Care | Source: Ambulatory Visit | Attending: Pulmonary Disease | Admitting: Pulmonary Disease

## 2015-01-22 ENCOUNTER — Encounter: Payer: Self-pay | Admitting: Pulmonary Disease

## 2015-01-22 ENCOUNTER — Ambulatory Visit (INDEPENDENT_AMBULATORY_CARE_PROVIDER_SITE_OTHER): Admitting: Pulmonary Disease

## 2015-01-22 VITALS — BP 126/64 | HR 85 | Ht 69.0 in | Wt 220.8 lb

## 2015-01-22 DIAGNOSIS — R0902 Hypoxemia: Secondary | ICD-10-CM

## 2015-01-22 DIAGNOSIS — G4734 Idiopathic sleep related nonobstructive alveolar hypoventilation: Secondary | ICD-10-CM

## 2015-01-22 NOTE — Patient Instructions (Signed)
We will order a pulmonary function test and call you with the results We will check your oxygen level while you're sleeping with oxygen We will call you with the results of the chest x-ray We will see you back in July after you have had your polysomnogram

## 2015-01-22 NOTE — Assessment & Plan Note (Signed)
Austin [name] has been referred to me for evaluation of nocturnal hypoxemia. He was first seen by my partner in 2010 when he had a polysomnogram which was not consistent with obstructive or central sleep apnea He had an overnight oximetry at that time which was performed on 2 L nasal cannula and showed that his O2 saturation dropped below 89% for at least one hour.  He has some shortness of breath on exertion but it's not severe, he has no cough and no wheezing. He did smoke some cigarettes in the past. Objectively his lung exam is normal, his angulatory oximetry on room air is normal, and reviewing his chest x-ray from 2015 there is no pulmonary parenchymal abnormality. However, I did review the images from a CT abdomen from approximately 3 or 4 years ago and on the lung films there is some mild groundglass in a dependent fashion in the bases of his lungs with cardiomegaly. This would be most in keeping with the diagnosis of congestive heart failure secondary to his severe hypertension.  So to explain his nocturnal hypoxemia I think it's most important to first rule out causes of hypoventilation including obstructive sleep apnea as well as central sleep apnea. So I completely agree with a repeated polysomnogram. He is at increased risk for obstructive sleep apnea with his obesity and he does note vivid dreams which while may be secondary to his PTSD this is commonly reported by patients who have obstructive sleep apnea. Beyond that, I like to look for evidence of lung disease by checking a chest x-ray and pulmonary function test. I will also repeat an overnight oximetry test on 2 L to ensure that he is getting adequate oxygen. Finally, I like to get an echocardiogram to look for evidence of congestive heart failure considering his profound hypertension and high risk for hypertensive cardiomyopathy which could lead to pulmonary edema and hypoxemia.  Plan summary Echocardiogram Pulmonary function test Chest  x-ray Overnight oximetry on 2 L Come back and see me after polysomnogram

## 2015-01-22 NOTE — Progress Notes (Signed)
Subjective:    Patient ID: Austin [name], male    DOB: 1955/12/16, 59 y.o.   MRN: [id number]  HPI Chief Complaint  Patient presents with  . Pulmonary Consult    Referred by Dr. Brigitte Pulse for hypoxia. Pt states uses concentrator around 3 hours nightly.     This is a very pleasant 59 year old male who comes my clinic today for further evaluation of nocturnal hypoxemia. He had a normal childhood without respiratory illnesses and no history of asthma as a child. He smoked approximately three quarters of a pack of cigarettes off and on for approximately 25 years between [place]. During this time he spent significant amount of times on [place] when he says he did not smoke for several months out of the year. 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 He says that during this time he used breathing techniques which were common for divers which include shallow breathing.  He now works as a Librarian, academic for an office in town. He's never had a history of pulmonary embolism. He says that he has some very mild shortness of breath when he walks up hills but this is not a significant problem for him. He denies cough, wheezing, chest tightness. He says that he has had nocturnal hypoxemia for many years. This is accompanied by vivid dreams which he attributes to his PTSD. He has been told that he does snore. He frequently wakes up in the middle the night either due to back pain or his vivid dreams. He says that in the past he had a polysomnogram which did not show evidence of obstructive sleep apnea or central sleep apnea. He has been using 2 L nasal cannula since about 2010 when he sleeps.  Past Medical History  Diagnosis Date  . CAD (coronary artery disease)     a. LHC 10/11:  LAD 40-50%; CFX 30%; OM2 30%; RCA 70-80% (tx with DES), 50-60% distal/ Promus DES to RCA 06/05/2010 [facility] trial)/ EF 60% on cath;  b.  Myoview 2/12: No  ischemia, mild apical thinning, EF 55%;   c.  Echo 4/11: Mild focal basal and mild concentric hypertrophy the septum, EF 47-42%, grade 2 diastolic dysfunction, mild LAE  . Pancreatitis   . Hyperlipidemia   . Hypertension   . 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 Obesity   . Carotid stenosis 02/2007    a.  0-39% bilateral; b. dopplers 05/2010: 59-56% RICA; 3-87% LICA;  c. dopplers 12/6431:  40-59% bilat ICA (repeat 1 year)  . Bell's palsy 07/2009  . Insomnia   . PTSD (post-traumatic stress disorder)   . Dyspnea 05/2008    Myoview; TM/myoview 63% no ischemia/ scar  . Diabetes mellitus     SINCE 2004  . Seasonal allergies      Family History  Problem Relation Age of Onset  . Hypertension Other   . Hypertension Mother   . Emphysema Brother   . Stroke Brother      History   Social History  . Marital Status: Married    Spouse Name: N/A  . Number of Children: N/A  . Years of Education: N/A   Occupational History  . PA     [employer]  . Retired    Social History Main Topics  . Smoking status: Former Smoker    Quit date: 01/21/1998  .  Smokeless tobacco: Not on file  . Alcohol Use: 0.6 oz/week    1 Standard drinks or equivalent per week  . Drug Use: No  . Sexual Activity: Not on file   Other Topics Concern  . Not on file   Social History Narrative   Disabled   Married   Former Scientist, clinical (histocompatibility and immunogenetics)   Regular exercise     Allergies  Allergen Reactions  . Morphine Other (See Comments)    hallucinations  . Nifedipine Other (See Comments)    Increase in Blood Pressure  . Darvon [Propoxyphene] Itching  . Tramadol Other (See Comments)    Hives and itching  . Amoxicillin Hives and Rash  . Amoxicillin-Pot Clavulanate Hives and Rash     Outpatient Prescriptions Prior to Visit  Medication Sig Dispense Refill  . amLODipine-benazepril (LOTREL) 10-40 MG per capsule TAKE 1 CAPSULE DAILY 90 capsule 1  . aspirin 81  MG tablet Take 81 mg by mouth daily.    Marland Kitchen atorvastatin (LIPITOR) 20 MG tablet TAKE 1 TABLET (20 MG TOTAL) DAILY AT 6 P.M. 90 tablet 1  . B Complex-C (SUPER B COMPLEX PO) Take 1 tablet by mouth daily.      . Canagliflozin (INVOKANA) 300 MG TABS Take 1 tablet by mouth daily with breakfast.    . cloNIDine (CATAPRES) 0.2 MG tablet TAKE 1 TABLET 6 TIMES DAILY 540 tablet 1  . Coenzyme Q10 (COQ-10) 400 MG CAPS Take 400 mg by mouth daily.    Marland Kitchen COREG CR 40 MG 24 hr capsule TAKE 1 CAPSULE DAILY 90 capsule 1  . furosemide (LASIX) 40 MG tablet TAKE 1 TABLET DAILY 90 tablet 1  . glyBURIDE-metformin (GLUCOVANCE) 2.5-500 MG per tablet Take 2 tablets by mouth 2 (two) times daily.      . insulin aspart protamine-insulin aspart (NOVOLOG MIX 70/30) (70-30) 100 UNIT/ML injection Inject 20-75 Units into the skin 3 (three) times daily with meals. Sliding scale. Pt will take between 20 -75 units depending on what he eats and sugar levels, i.e. Toast 20 units, i.e. [place] fries 75 units    . levocetirizine (XYZAL) 5 MG tablet Take 5 mg by mouth every evening.    Marland Kitchen losartan-hydrochlorothiazide (HYZAAR) 50-12.5 MG per tablet TAKE 2 TABLETS DAILY AT 12 NOON 180 tablet 1  . minoxidil (LONITEN) 10 MG tablet TAKE 2 TABLETS (20 MG TOTAL) TWO TIMES DAILY 360 tablet 2  . nitroGLYCERIN (NITROSTAT) 0.4 MG SL tablet Place 1 tablet (0.4 mg total) under the tongue every 5 (five) minutes as needed. For chest pain 25 tablet 3  . Omega-3 Fatty Acids (FISH OIL) 1200 MG CAPS Take 1 capsule by mouth daily.     . potassium chloride SA (K-DUR,KLOR-CON) 20 MEQ tablet Take 1 tablet (20 mEq total) by mouth 2 (two) times daily. 90 tablet 3   No facility-administered medications prior to visit.       Review of Systems  Constitutional: Positive for diaphoresis. Negative for fever, chills, activity change and appetite change.  HENT: Negative for congestion, ear pain, hearing loss, postnasal drip, rhinorrhea, sinus pressure and sneezing.     Eyes: Negative for redness, itching and visual disturbance.  Respiratory: Positive for shortness of breath. Negative for cough, chest tightness and wheezing.   Cardiovascular: Negative for chest pain, palpitations and leg swelling.  Gastrointestinal: Negative for nausea, vomiting, abdominal pain, diarrhea, constipation, blood in stool and abdominal distention.  Musculoskeletal: Positive for back pain. Negative for myalgias, joint swelling, arthralgias, gait problem, neck pain  and neck stiffness.  Skin: Negative for rash.  Neurological: Negative for dizziness, light-headedness, numbness and headaches.  Hematological: Does not bruise/bleed easily.  Psychiatric/Behavioral: Negative for confusion and dysphoric mood.       Objective:   Physical Exam Filed Vitals:   01/22/15 1430  BP: 126/64  Pulse: 85  Height: _0  (1.753 m)  Weight: 220 lb 12.8 oz (100.154 kg)  SpO2: 94%   RA  Ambulated 500 feet on room air and O2 saturation remained above 93%  Gen: well appearing, no acute distress HENT: NCAT, OP clear, neck supple without masses Eyes: PERRL, EOMi Lymph: no cervical lymphadenopathy PULM: Crackles R base, scant, otherwise clear with normal air movement CV: RRR, no mgr, no JVD GI: BS+, soft, nontender, no hsm Derm: no rash or skin breakdown MSK: normal bulk and tone Neuro: A&Ox4, CN II-XII intact, strength 5/5 in all 4 extremities Psyche: normal mood and affect   Office notes from cardiology reviewed where he was recently started on Spiriva lactone for severe hypertension, he has a history of coronary artery disease and PCI Chest x-ray from 2015 reviewed which showed no pulmonary parenchymal abnormality     Assessment & Plan:  Nocturnal hypoxemia Mr. Selders has been referred to me for evaluation of nocturnal hypoxemia. He was first seen by my partner in 2010 when he had a polysomnogram which was not consistent with obstructive or central sleep apnea He had an overnight  oximetry at that time which was performed on 2 L nasal cannula and showed that his O2 saturation dropped below 89% for at least one hour.  He has some shortness of breath on exertion but it's not severe, he has no cough and no wheezing. He did smoke some cigarettes in the past. Objectively his lung exam is normal, his angulatory oximetry on room air is normal, and reviewing his chest x-ray from 2015 there is no pulmonary parenchymal abnormality. However, I did review the images from a CT abdomen from approximately 3 or 4 years ago and on the lung films there is some mild groundglass in a dependent fashion in the bases of his lungs with cardiomegaly. This would be most in keeping with the diagnosis of congestive heart failure secondary to his severe hypertension.  So to explain his nocturnal hypoxemia I think it's most important to first rule out causes of hypoventilation including obstructive sleep apnea as well as central sleep apnea. So I completely agree with a repeated polysomnogram. He is at increased risk for obstructive sleep apnea with his obesity and he does note vivid dreams which while may be secondary to his PTSD this is commonly reported by patients who have obstructive sleep apnea. Beyond that, I like to look for evidence of lung disease by checking a chest x-ray and pulmonary function test. I will also repeat an overnight oximetry test on 2 L to ensure that he is getting adequate oxygen. Finally, I like to get an echocardiogram to look for evidence of congestive heart failure considering his profound hypertension and high risk for hypertensive cardiomyopathy which could lead to pulmonary edema and hypoxemia.  Plan summary Echocardiogram Pulmonary function test Chest x-ray Overnight oximetry on 2 L Come back and see me after polysomnogram      Current outpatient prescriptions:  .  amLODipine-benazepril (LOTREL) 10-40 MG per capsule, TAKE 1 CAPSULE DAILY, Disp: 90 capsule, Rfl: 1 .   aspirin 81 MG tablet, Take 81 mg by mouth daily., Disp: , Rfl:  .  atorvastatin (  LIPITOR) 20 MG tablet, TAKE 1 TABLET (20 MG TOTAL) DAILY AT 6 P.M., Disp: 90 tablet, Rfl: 1 .  B Complex-C (SUPER B COMPLEX PO), Take 1 tablet by mouth daily.  , Disp: , Rfl:  .  Canagliflozin (INVOKANA) 300 MG TABS, Take 1 tablet by mouth daily with breakfast., Disp: , Rfl:  .  clonazePAM (KLONOPIN) 0.5 MG tablet, Take 0.5 mg by mouth 2 (two) times daily., Disp: , Rfl:  .  cloNIDine (CATAPRES) 0.2 MG tablet, TAKE 1 TABLET 6 TIMES DAILY, Disp: 540 tablet, Rfl: 1 .  Coenzyme Q10 (COQ-10) 400 MG CAPS, Take 400 mg by mouth daily., Disp: , Rfl:  .  COREG CR 40 MG 24 hr capsule, TAKE 1 CAPSULE DAILY, Disp: 90 capsule, Rfl: 1 .  furosemide (LASIX) 40 MG tablet, TAKE 1 TABLET DAILY, Disp: 90 tablet, Rfl: 1 .  glyBURIDE-metformin (GLUCOVANCE) 2.5-500 MG per tablet, Take 2 tablets by mouth 2 (two) times daily.  , Disp: , Rfl:  .  insulin aspart protamine-insulin aspart (NOVOLOG MIX 70/30) (70-30) 100 UNIT/ML injection, Inject 20-75 Units into the skin 3 (three) times daily with meals. Sliding scale. Pt will take between 20 -75 units depending on what he eats and sugar levels, i.e. Toast 20 units, i.e. [place] fries 75 units, Disp: , Rfl:  .  levocetirizine (XYZAL) 5 MG tablet, Take 5 mg by mouth every evening., Disp: , Rfl:  .  losartan-hydrochlorothiazide (HYZAAR) 50-12.5 MG per tablet, TAKE 2 TABLETS DAILY AT 12 NOON, Disp: 180 tablet, Rfl: 1 .  minoxidil (LONITEN) 10 MG tablet, TAKE 2 TABLETS (20 MG TOTAL) TWO TIMES DAILY, Disp: 360 tablet, Rfl: 2 .  nitroGLYCERIN (NITROSTAT) 0.4 MG SL tablet, Place 1 tablet (0.4 mg total) under the tongue every 5 (five) minutes as needed. For chest pain, Disp: 25 tablet, Rfl: 3 .  Omega-3 Fatty Acids (FISH OIL) 1200 MG CAPS, Take 1 capsule by mouth daily. , Disp: , Rfl:  .  potassium chloride SA (K-DUR,KLOR-CON) 20 MEQ tablet, Take 1 tablet (20 mEq total) by mouth 2 (two) times daily., Disp:  90 tablet, Rfl: 3

## 2015-01-24 ENCOUNTER — Telehealth: Payer: Self-pay

## 2015-01-24 NOTE — Telephone Encounter (Signed)
Result Notes     Notes Recorded by Juanito Doom, MD on 01/24/2015 at 11:51 AM A, Please let the patient know this was OK Thanks, B   (result note closed in error)  lmtcb X1 to relay results to pt.

## 2015-01-25 NOTE — Telephone Encounter (Signed)
Pt called back and made aware of results. Nothing more needed at this time.

## 2015-02-01 ENCOUNTER — Encounter: Payer: Self-pay | Admitting: Radiology

## 2015-02-01 ENCOUNTER — Encounter (INDEPENDENT_AMBULATORY_CARE_PROVIDER_SITE_OTHER)

## 2015-02-01 ENCOUNTER — Other Ambulatory Visit: Payer: Self-pay | Admitting: Neurosurgery

## 2015-02-01 ENCOUNTER — Other Ambulatory Visit: Payer: Self-pay

## 2015-02-01 ENCOUNTER — Ambulatory Visit (HOSPITAL_COMMUNITY): Attending: Cardiovascular Disease

## 2015-02-01 DIAGNOSIS — I10 Essential (primary) hypertension: Secondary | ICD-10-CM

## 2015-02-01 DIAGNOSIS — M546 Pain in thoracic spine: Secondary | ICD-10-CM

## 2015-02-01 DIAGNOSIS — G4734 Idiopathic sleep related nonobstructive alveolar hypoventilation: Secondary | ICD-10-CM | POA: Diagnosis not present

## 2015-02-01 DIAGNOSIS — I313 Pericardial effusion (noninflammatory): Secondary | ICD-10-CM | POA: Insufficient documentation

## 2015-02-01 DIAGNOSIS — M5136 Other intervertebral disc degeneration, lumbar region: Secondary | ICD-10-CM

## 2015-02-01 DIAGNOSIS — I517 Cardiomegaly: Secondary | ICD-10-CM | POA: Diagnosis not present

## 2015-02-01 DIAGNOSIS — M542 Cervicalgia: Secondary | ICD-10-CM

## 2015-02-01 DIAGNOSIS — R0600 Dyspnea, unspecified: Secondary | ICD-10-CM | POA: Diagnosis present

## 2015-02-01 NOTE — Progress Notes (Signed)
Patient ID: Austin [name], male   DOB: [birth year], 59 y.o.   MRN: [id number] 24 hr Blood pressure monitor applied

## 2015-02-04 ENCOUNTER — Telehealth: Payer: Self-pay | Admitting: Cardiovascular Disease

## 2015-02-04 ENCOUNTER — Encounter: Payer: Self-pay | Admitting: Cardiovascular Disease

## 2015-02-04 NOTE — Telephone Encounter (Signed)
New message    Patient calling     Echo complete last Friday .   Put up 24 hour b/p monitor - turn in this am to front desk.    When would be his next appt with HTN.

## 2015-02-04 NOTE — Telephone Encounter (Signed)
I have responded to the pt's message through My Chart as he sent a My Chart message and called the office.  I will forward this message to Elberta Leatherwood Pharm-D to follow up with the pt in regards to next appointment with HTN clinic.

## 2015-02-05 ENCOUNTER — Telehealth: Payer: Self-pay | Admitting: Pulmonary Disease

## 2015-02-05 ENCOUNTER — Ambulatory Visit (INDEPENDENT_AMBULATORY_CARE_PROVIDER_SITE_OTHER): Admitting: Neurology

## 2015-02-05 ENCOUNTER — Encounter: Payer: Self-pay | Admitting: Neurology

## 2015-02-05 VITALS — BP 144/66 | HR 86 | Resp 20 | Ht 68.11 in | Wt 220.0 lb

## 2015-02-05 DIAGNOSIS — R0683 Snoring: Secondary | ICD-10-CM

## 2015-02-05 DIAGNOSIS — E662 Morbid (severe) obesity with alveolar hypoventilation: Secondary | ICD-10-CM

## 2015-02-05 DIAGNOSIS — R0902 Hypoxemia: Secondary | ICD-10-CM | POA: Diagnosis not present

## 2015-02-05 DIAGNOSIS — G4710 Hypersomnia, unspecified: Secondary | ICD-10-CM | POA: Diagnosis not present

## 2015-02-05 DIAGNOSIS — E874 Mixed disorder of acid-base balance: Secondary | ICD-10-CM | POA: Diagnosis not present

## 2015-02-05 DIAGNOSIS — G4730 Sleep apnea, unspecified: Secondary | ICD-10-CM

## 2015-02-05 DIAGNOSIS — Z9981 Dependence on supplemental oxygen: Secondary | ICD-10-CM

## 2015-02-05 NOTE — Telephone Encounter (Signed)
Spoke with pt and advised of Echo results per Dr Lake Bells. Pt also requesting results of ONO done by [facility]  Several nights ago.  Caryl Pina, have you seen these results?

## 2015-02-05 NOTE — Progress Notes (Signed)
Austin [name]   Provider:  Larey [name], M D  Referring Provider: Marton Redwood, MD Primary Care Physician:  Austin Redwood, MD  Chief Complaint  Patient presents with  . sleep consult    rm 11, new patient, alone    HPI:  Austin [name] is a 59 y.o. 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 Austin [name] is a Caucasian left-handed male Ht 33 has recently also undergone an echocardiogram. The patient was recommended to undergo a polysomnography because he had a slight elevated pulmonary blood pressure. The nodes were recorded by Austin [name]. He underwent an overnight [name] oximetry through his primary care office and this showed tachybradycardia arrhythmia the lowest [name] was in borderline region of 50 bpm but the highest [name] was 113 bpm at night. He also spent a total  time in hypoxemia , he was 4 297 minutes at night at or below 89% saturation and for 218 minutes at or below 88% saturation he was placed on oxygen after the study was obtained on 4-20 4-16 since then he had a repeat [name] oximetry on oxygen, he also had a Holter monitor both last last week and the results are not yet known to him. With that he had 2 previous sleep studies that did not show central or obstructive sleep apnea. 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   Review of Systems: Out of a complete 14 system review, the patient complains of only the following symptoms, and all other reviewed systems are negative.  snoring, weight gain,   Epworth score 21 , Fatigue severity score 39  , depression score n/a    History   Social History  . Marital Status: Married    Spouse Name: N/A  . Number of Children: N/A  . Years of Education: N/A     Occupational History  . PA     [employer]  . Retired    Social History Main Topics  . Smoking status: Former Smoker    Quit date: 01/21/1998  . Smokeless tobacco: Not on file  . Alcohol Use: 0.6 oz/week    1 Standard drinks or equivalent per week  . Drug Use: No  . Sexual Activity: Not on file   Other Topics Concern  . Not on file   Social History Narrative   Disabled   Married   Former Scientist, clinical (histocompatibility and immunogenetics)   Regular exercise    Family History  Problem Relation Age of Onset  . Hypertension Other   . Hypertension Mother   . Emphysema Brother   . Stroke Brother     Past Medical History  Diagnosis Date  . CAD (coronary artery disease)     a. LHC 10/11:  LAD 40-50%; CFX 30%; OM2 30%; RCA 70-80% (tx with DES), 50-60% distal/ Promus DES to RCA 06/05/2010 [facility] trial)/ EF 60% on cath;  b.  Myoview 2/12: No ischemia, mild apical thinning, EF 55%;   c.  Echo 4/11: Mild focal basal and mild concentric hypertrophy the septum, EF 96-04%, grade 2 diastolic dysfunction, mild LAE  . Pancreatitis   . Hyperlipidemia   . Hypertension   . Sleep-related hypoventilation     Former Scientist, clinical (histocompatibility and immunogenetics); noctural desaturaion; no  apnea  . Obesity   . Carotid stenosis 02/2007    a.  0-39% bilateral; b. dopplers 05/2010: 91-47% RICA; 8-29% LICA;  c. dopplers 12/6211:  40-59% bilat ICA (repeat 1 year)  . Bell's palsy 07/2009  . Insomnia   . PTSD (post-traumatic stress disorder)   . Dyspnea 05/2008    Myoview; TM/myoview 63% no ischemia/ scar  . Diabetes mellitus     SINCE 2004  . Seasonal allergies     Past Surgical History  Procedure Laterality Date  . Cardiac catheterization      LAD 40-50%; CFX 30%; OM2 30%; RCA 70-80% (tx with DES), 50-60% distal  . Lumbar disc surgery      C5-6  . Appendectomy    . Tonsillectomy    . Cervical spine surgery      2006  . Cholecystectomy  09/16/2012    Procedure: LAPAROSCOPIC CHOLECYSTECTOMY WITH INTRAOPERATIVE CHOLANGIOGRAM;   Surgeon: Austin Hollingshead, MD;  Location: [facility];  Service: General;  Laterality: N/A;    Current Outpatient Prescriptions  Medication Sig Dispense Refill  . amLODipine-benazepril (LOTREL) 10-40 MG per capsule TAKE 1 CAPSULE DAILY 90 capsule 1  . aspirin 81 MG tablet Take 81 mg by mouth daily.    Marland Kitchen atorvastatin (LIPITOR) 20 MG tablet TAKE 1 TABLET (20 MG TOTAL) DAILY AT 6 P.M. 90 tablet 1  . B Complex-C (SUPER B COMPLEX PO) Take 1 tablet by mouth daily.      . Canagliflozin (INVOKANA) 300 MG TABS Take 1 tablet by mouth daily with breakfast.    . clonazePAM (KLONOPIN) 0.5 MG tablet Take 0.5 mg by mouth 2 (two) times daily.    . cloNIDine (CATAPRES) 0.2 MG tablet TAKE 1 TABLET 6 TIMES DAILY 540 tablet 1  . Coenzyme Q10 (COQ-10) 400 MG CAPS Take 400 mg by mouth daily.    Marland Kitchen COREG CR 40 MG 24 hr capsule TAKE 1 CAPSULE DAILY 90 capsule 1  . furosemide (LASIX) 40 MG tablet TAKE 1 TABLET DAILY 90 tablet 1  . glyBURIDE-metformin (GLUCOVANCE) 2.5-500 MG per tablet Take 2 tablets by mouth 2 (two) times daily.      . insulin aspart protamine-insulin aspart (NOVOLOG MIX 70/30) (70-30) 100 UNIT/ML injection Inject 20-75 Units into the skin 3 (three) times daily with meals. Sliding scale. Pt will take between 20 -75 units depending on what he eats and sugar levels, i.e. Toast 20 units, i.e. [place] fries 75 units    . levocetirizine (XYZAL) 5 MG tablet Take 5 mg by mouth every evening.    Marland Kitchen losartan-hydrochlorothiazide (HYZAAR) 50-12.5 MG per tablet TAKE 2 TABLETS DAILY AT 12 NOON 180 tablet 1  . minoxidil (LONITEN) 10 MG tablet TAKE 2 TABLETS (20 MG TOTAL) TWO TIMES DAILY 360 tablet 2  . nitroGLYCERIN (NITROSTAT) 0.4 MG SL tablet Place 1 tablet (0.4 mg total) under the tongue every 5 (five) minutes as needed. For chest pain 25 tablet 3  . Omega-3 Fatty Acids (FISH OIL) 1200 MG CAPS Take 1 capsule by mouth daily.     . potassium chloride SA (K-DUR,KLOR-CON) 20 MEQ tablet Take 1 tablet (20 mEq total) by mouth 2  (two) times daily. 90 tablet 3   No current facility-administered medications for this visit.    Allergies as of 02/05/2015 - Review Complete 02/05/2015  Allergen Reaction Noted  . Morphine Other (See Comments)   . Nifedipine Other (See Comments)   . Darvon [propoxyphene] Itching 09/01/2012  . Tramadol Other (See Comments) 01/15/2012  .  Amoxicillin Hives and Rash   . Amoxicillin-pot clavulanate Hives and Rash     Vitals: BP 144/66 mmHg  [name] 86  Resp 20  Ht 5' 8.11" (1.73 m)  Wt 220 lb (99.791 kg)  BMI 33.34 kg/m2 Last Weight:  Wt Readings from Last 1 Encounters:  02/05/15 220 lb (99.791 kg)       Last Height:   Ht Readings from Last 1 Encounters:  02/05/15 5' 8.11" (1.73 m)    Physical exam:  General: The patient is awake, alert and appears not in acute distress. The patient is well groomed. Head: Normocephalic, atraumatic. Neck is supple. Mallampati 3 ,  the left now the on is more narrowed than the right but appears patent, the mucous lining is reddened and swollen.  neck circumference:22. Nasal airflow as above  TMJ is not  evident . Retrognathia is not seen.  Cardiovascular:  Regular rate and rhythm , without  murmurs or carotid bruit, and without distended neck veins. Respiratory: Lungs are clear to auscultation. Skin:  Without evidence of edema, or rash Trunk: BMI is  elevated and patient has normal posture. Anterior cervical fusion patient , low ROM in the neck and back pain.   Neurologic exam : The patient is awake and alert, oriented to place and time.   Memory subjective described as intact. There is a normal attention span & concentration ability.  Speech is fluent without  dysarthria, dysphonia or aphasia. Mood and affect are appropriate.  Cranial nerves: Pupils are equal and briskly reactive to light. Funduscopic exam without evidence of pallor or edema.  Extraocular movements  in vertical and horizontal planes intact and without nystagmus. Visual fields  by finger perimetry are intact. Hearing to finger rub intact.  Facial sensation intact to fine touch. Facial motor strength is symmetric and tongue and uvula move midline.  Motor exam:  Normal tone, muscle bulk and symmetric ,strength in all extremities. Sensory:  Fine touch, pinprick and vibration were tested in all extremities. Proprioception is normal. Coordination: Rapid alternating movements in the fingers/hands is normal.  Finger-to-nose maneuver normal without evidence of ataxia, dysmetria or tremor. Gait and station: Patient walks without assistive device and is able unassisted to climb up to the exam table. Strength within normal limits. Stance is stable and normal.  Deep tendon reflexes: in the  upper and lower extremities are symmetric and intact. Babinski maneuver response is downgoing.   Assessment:  After physical and neurologic examination, review of laboratory studies, imaging, neurophysiology testing and pre-existing records, assessment is   1) Mr. Mejorado has been diagnosed with a mild pulmonary hypertension but he has thus far failed to ever test positive for sleep apnea.  He reports snoring he has lost  weight the last time I saw the patient was 7 years ago.  2) hypoxemia - over a total test time of 7 hours duration the patient tested positive for hypoxemia for 297 minutes. He was placed on oxygen but I do not have available his [name] oxygen [name] oximetry. It is more positive that this will treat his pulmonary hypertension as well. In addition we will set him up for sleep study this will be his third. He is excessively daytime sleepy by Epworth score, is fatigued and he carries from years ago the diagnosis of PTSD has frequent nightmares and often fragmented sleep. Based on this we will do an attended sleep study.  The patient was advised of the nature of the diagnosed sleep disorder , the treatment  options and risks for general a health and wellness arising from not treating  the condition. Visit duration was 30 minutes.   Plan:  Treatment plan and additional workup :  SPLIT BCBS criteriae,  AHI 15 and score at 3% . patient on oxygen at home, please provide if he fails to qualify for CPAP , but is hypoxemic.       Asencion Partridge Maciah Schweigert MD  02/05/2015

## 2015-02-05 NOTE — Patient Instructions (Signed)
Hypoxemia Hypoxemia occurs when your blood does not contain enough oxygen. The body cannot work well when it does not have enough oxygen because every part of your body needs oxygen. Oxygen travels to all parts of the body through your blood. Hypoxemia can develop suddenly or can come on slowly. CAUSES Some common causes of hypoxemia include:  Long-term (chronic) lung diseases, such as chronic obstructive pulmonary disease (COPD) or interstitial lung disease.  Disorders that affect breathing at night, such as sleep apnea.  Fluid buildup in your lungs (pulmonary edema).  Lung infection (pneumonia).  Lung or throat cancer.  Abnormal blood flow that bypasses the lungs (shunt).  Certain diseasesthat affect nerves or muscles.  A collapsed lung (pneumothorax).  A blood clot in the lungs (pulmonary embolus).  Certain types of heart disease.  Slow or shallow breathing (hypoventilation).  Certain medicines.  High altitudes.  Toxic chemicals and gases. SIGNS AND SYMPTOMS Not everyone who has hypoxemia will develop symptoms. If the hypoxemia developed quickly, you will likely have symptoms such as shortness of breath. If the hypoxemia came on slowly over months or years, you may not notice any symptoms. Symptoms can include:  Shortness of breath (dyspnea).  Bluish color of the skin, lips, or nail beds.  Breathing that is fast, noisy, or shallow.  A fast heartbeat.  Feeling tired or sleepy.  Being confused or feeling anxious. DIAGNOSIS To determine if you have hypoxemia, your health care provider may perform:  A physical exam.  Blood tests.  A pulse oximetry. A sensor will be put on your finger, toe, or earlobe to measure the percent of oxygen in your blood. TREATMENT You will likely be treated with oxygen therapy. Depending on the cause of your hypoxemia, you may need oxygen for a short time (weeks or months), or you may need it indefinitely. Your health care provider  may also recommend other therapies to treat the underlying cause of your hypoxemia. HOME CARE INSTRUCTIONS  Only take over-the-counter or prescription medicines as directed by your health care provider.  Follow oxygen safety measures if you are on oxygen therapy. These may include:  Always having a backup supply of oxygen.  Not allowing anyone to smoke around oxygen.  Handling the oxygen tanks carefully and as instructed.  If you smoke, quit. Stay away from people who smoke.  Follow up with your health care provider as directed. SEEK MEDICAL CARE IF:  You have any concerns about your oxygen therapy.  You still have trouble breathing.  You become short of breath when you exercise.  You are tired when you wake up.  You have a headache when you wake up. SEEK IMMEDIATE MEDICAL CARE IF:   Your breathing gets worse.  You have new shortness of breath with normal activity.  You have a bluish color of the skin, lips, or nail beds.  You have confusion or cloudy thinking.  You cough up dark mucus.  You have chest pain.  You have a fever. MAKE SURE YOU:  Understand these instructions.  Will watch your condition.  Will get help right away if you are not doing well or get worse. Document Released: 02/16/2011 Document Revised: 08/08/2013 Document Reviewed: 03/02/2013 [facility] Patient Information 2015 [place], [place]. This information is not intended to replace advice given to you by your health care provider. Make sure you discuss any questions you have with your health care provider.

## 2015-02-05 NOTE — Telephone Encounter (Signed)
I have not yet seen ONO results.  It can take up a couple of weeks for [place] to get these results.

## 2015-02-05 NOTE — Telephone Encounter (Signed)
Per BQ: A,  Please let him know that his ONO on 2L was normal   Thanks  B  --------------------------------  lmtcb X1 for pt to relay results.

## 2015-02-05 NOTE — Telephone Encounter (Signed)
Pt aware of results.  Nothing further needed.

## 2015-02-05 NOTE — Telephone Encounter (Signed)
Pt returning call.Austin [name]

## 2015-02-11 ENCOUNTER — Other Ambulatory Visit: Payer: Self-pay

## 2015-02-11 ENCOUNTER — Encounter: Payer: Self-pay | Admitting: Cardiovascular Disease

## 2015-02-11 ENCOUNTER — Encounter: Payer: Self-pay | Admitting: Pulmonary Disease

## 2015-02-11 ENCOUNTER — Encounter: Payer: Self-pay | Admitting: Pharmacist

## 2015-02-11 MED ORDER — CHLORTHALIDONE 25 MG PO TABS: 25.0000 mg | ORAL_TABLET | Freq: Every day | ORAL | Status: DC

## 2015-02-11 MED ORDER — AZILSARTAN MEDOXOMIL 40 MG PO TABS: 40.0000 mg | ORAL_TABLET | Freq: Every day | ORAL | Status: DC

## 2015-02-11 NOTE — Telephone Encounter (Signed)
Spoke with pt about results.  Will change losartan/HCTZ to Edarbyclor 40/25 (edarbi plus chlorthalidone).  Plan to recheck BP on 7/15.  Will need repeat blood work.  If BP improves, will need to stop benazepril as little benefit seen with ACE and ARB combo.

## 2015-02-19 ENCOUNTER — Ambulatory Visit
Admission: RE | Admit: 2015-02-19 | Discharge: 2015-02-19 | Disposition: A | Discharge status: Home / Self Care | Source: Ambulatory Visit | Attending: Neurosurgery | Admitting: Neurosurgery

## 2015-02-19 DIAGNOSIS — M542 Cervicalgia: Secondary | ICD-10-CM

## 2015-02-19 DIAGNOSIS — M5136 Other intervertebral disc degeneration, lumbar region: Secondary | ICD-10-CM

## 2015-02-19 DIAGNOSIS — M546 Pain in thoracic spine: Secondary | ICD-10-CM

## 2015-02-21 ENCOUNTER — Other Ambulatory Visit (HOSPITAL_COMMUNITY): Payer: Self-pay | Admitting: Respiratory Therapy

## 2015-02-22 ENCOUNTER — Ambulatory Visit (INDEPENDENT_AMBULATORY_CARE_PROVIDER_SITE_OTHER): Admitting: Pulmonary Disease

## 2015-02-22 DIAGNOSIS — R0902 Hypoxemia: Secondary | ICD-10-CM

## 2015-02-22 LAB — PULMONARY FUNCTION TEST
DL/VA % pred: 130 %
DL/VA: 5.96 ml/min/mmHg/L
DLCO unc % pred: 104 %
DLCO unc: 32.47 ml/min/mmHg
FEF 25-75 Post: 3.99 L/sec
FEF 25-75 Pre: 3.45 L/sec
FEF2575-%Change-Post: 15 %
FEF2575-%PRED-POST: 137 %
FEF2575-%PRED-PRE: 119 %
FEV1-%Change-Post: 4 %
FEV1-%PRED-POST: 90 %
FEV1-%PRED-PRE: 86 %
FEV1-POST: 3.16 L
FEV1-Pre: 3.03 L
FEV1FVC-%CHANGE-POST: 3 %
FEV1FVC-%Pred-Pre: 109 %
FEV6-%Change-Post: 0 %
FEV6-%PRED-PRE: 82 %
FEV6-%Pred-Post: 82 %
FEV6-Post: 3.64 L
FEV6-Pre: 3.63 L
FEV6FVC-%Change-Post: 0 %
FEV6FVC-%PRED-POST: 105 %
FEV6FVC-%Pred-Pre: 105 %
FVC-%CHANGE-POST: 0 %
FVC-%Pred-Post: 78 %
FVC-%Pred-Pre: 78 %
FVC-PRE: 3.63 L
FVC-Post: 3.64 L
PRE FEV6/FVC RATIO: 100 %
Post FEV1/FVC ratio: 87 %
Post FEV6/FVC ratio: 100 %
Pre FEV1/FVC ratio: 84 %
RV % PRED: 78 %
RV: 1.72 L
TLC % pred: 80 %
TLC: 5.49 L

## 2015-02-22 NOTE — Progress Notes (Signed)
PFT done today. 

## 2015-02-27 ENCOUNTER — Telehealth: Payer: Self-pay | Admitting: Pharmacist

## 2015-02-27 NOTE — Telephone Encounter (Signed)
Pt called to report side effects with chlorthalidone.  He states he has headache approximately 45 minutes after he takes the dose.  He also states his HR increased to approximately 100 bpm and loose bowels.  I have asked him to hold his cholorthalidone for now and we will discuss options at his visit on Friday.

## 2015-03-01 ENCOUNTER — Ambulatory Visit (INDEPENDENT_AMBULATORY_CARE_PROVIDER_SITE_OTHER): Admitting: Pharmacist

## 2015-03-01 VITALS — BP 170/84

## 2015-03-01 DIAGNOSIS — I10 Essential (primary) hypertension: Secondary | ICD-10-CM | POA: Diagnosis not present

## 2015-03-04 ENCOUNTER — Encounter: Payer: Self-pay | Admitting: Pharmacist

## 2015-03-04 ENCOUNTER — Telehealth: Payer: Self-pay | Admitting: Pharmacist

## 2015-03-04 ENCOUNTER — Encounter: Payer: Self-pay | Admitting: Pulmonary Disease

## 2015-03-04 NOTE — Telephone Encounter (Signed)
New message      Talk to Gay Filler to see when he is to come in and see you again. Labs are scheduled for end of July.  Please call

## 2015-03-06 ENCOUNTER — Telehealth: Payer: Self-pay | Admitting: Pulmonary Disease

## 2015-03-06 MED ORDER — AZILSARTAN MEDOXOMIL 80 MG PO TABS: 80.0000 mg | ORAL_TABLET | Freq: Every day | ORAL | Status: DC

## 2015-03-06 NOTE — Telephone Encounter (Signed)
PFT result note:  Please let the patient know this was OK  --  I spoke with patient about results and he verbalized understanding and had no questions

## 2015-03-06 NOTE — Telephone Encounter (Signed)
I sent pt a [name].  I will call him with his lab results from 7/29 and depending on those results and how his blood pressure has responded to the medication changes, we can determine follow up at that time.

## 2015-03-06 NOTE — Progress Notes (Signed)
Cardiology Office Note   ID:  GAHEL SAFLEY, [place], DOB [birth year], MRN [id number]  PCP:  Marton Redwood, MD  Cardiologist:  Sherren Mocha, MD   Chief Complaint  Patient presents with  . 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 male who was referred by Dr. Burt Knack for management of his hypertension.  He has a PMH significant for CAD s/p PCI to the RCA in 2011, HTN, DM.  Pt has many problems with his BP. He wakes up at times with headaches and checks his BP.  He will then take up to 0.65m of clonidine during the night to improve his BP and headache.  He recently wore a pulse ox during the night and recorded >300 desaturations during that time.  He now has O2 he wears at night.   He states he is compliant with all of his medications.  He was in a previous study for renal artery ablation ([facility]) but was not a candidate because he did not have enough renal artery stenosis. He is trying to get copies of these records now.     After his last visit, we asked Mr. HEplinto wear an 24 ambulatory BP monitor.  Pt completed this.  His average BP were 1573Usystolic during waking hours but would dip to ~~202Rsystolic at night.  His diastolic was fairly well controlled throughout the course of the study.  At that time, we changed him from losartan/HCTZ to Edarbi 431mdaily and chlorthalidone 2557maily.  He states the chlorthalidone made his HR increase and HA.  He stopped this on 7/13.  At home he states his BP has not shown much improvement with this change.  He is still waking up at night to take more clonidine.  He does have chronic pain that he currently does not take any medications for and he is concerned this may also be contributing to his high blood pressure.   Current BP Medications (as taken throughout day):  Morning:  Minoxidil 23m87menazepril/amlodipine 40/23mg17mrosemide 40mg 43mnidine 0.2mg   81mch:  Clonidine 0.2mg  Ed47mi 40mg   B61mme:   Clonidine 0.2mg  Mino83mil 23mg  Core42m 40mg   PRN:63monidine 0.2mg (up to 052mg extra/day73mIntolerance: spironolactone (GI upset), clonidine patch (skin irritation), chlorthalidone (tachycardia, HA, GI upset)  Diet/Exercise Pt states he tries to watch his diet.  He limites all salt.  [place] [address] [place]  It takes him ~50 minutes to walk the loop.     Current Outpatient Prescriptions  Medication Sig Dispense Refill  . amLODipine-benazepril (LOTREL) 10-40 MG per capsule TAKE 1 CAPSULE DAILY 90 capsule 1  . aspirin 81 MG tablet Take 81 mg by mouth daily.    . atorvastatinMarland Kitchen(LIPITOR) 20 MG tablet TAKE 1 TABLET (20 MG TOTAL) DAILY AT 6 P.M. 90 tablet 1  . Azilsartan Medoxomil (EDARBI) 40 MG TABS Take 40 mg by mouth daily. 28 tablet 0  . B Complex-C (SUPER B COMPLEX PO) Take 1 tablet by mouth daily.      . Canagliflozin (INVOKANA) 300 MG TABS Take 1 tablet by mouth daily with breakfast.    . chlorthalidone (HYGROTON) 25 MG tablet Take 1 tablet (25 mg total) by mouth daily. 30 tablet 1  . clonazePAM (KLONOPIN) 0.5 MG tablet Take 0.5 mg by mouth 2 (two) times daily.    . cloNIDine (CATAPRES)  0.2 MG tablet TAKE 1 TABLET 6 TIMES DAILY 540 tablet 1  . Coenzyme Q10 (COQ-10) 400 MG CAPS Take 400 mg by mouth daily.    Marland Kitchen COREG CR 40 MG 24 hr capsule TAKE 1 CAPSULE DAILY 90 capsule 1  . furosemide (LASIX) 40 MG tablet TAKE 1 TABLET DAILY 90 tablet 1  . glyBURIDE-metformin (GLUCOVANCE) 2.5-500 MG per tablet Take 2 tablets by mouth 2 (two) times daily.      . insulin aspart protamine-insulin aspart (NOVOLOG MIX 70/30) (70-30) 100 UNIT/ML injection Inject 20-75 Units into the skin 3 (three) times daily with meals. Sliding scale. Pt will take between 20 -75 units depending on what he eats and sugar levels, i.e. Toast 20 units, i.e. [place] fries 75 units    . levocetirizine (XYZAL) 5 MG tablet Take 5 mg by mouth every evening.    . minoxidil (LONITEN) 10 MG tablet TAKE 2  TABLETS (20 MG TOTAL) TWO TIMES DAILY 360 tablet 2  . nitroGLYCERIN (NITROSTAT) 0.4 MG SL tablet Place 1 tablet (0.4 mg total) under the tongue every 5 (five) minutes as needed. For chest pain 25 tablet 3  . Omega-3 Fatty Acids (FISH OIL) 1200 MG CAPS Take 1 capsule by mouth daily.     . potassium chloride SA (K-DUR,KLOR-CON) 20 MEQ tablet Take 1 tablet (20 mEq total) by mouth 2 (two) times daily. 90 tablet 3   No current facility-administered medications for this visit.   Allergies:   Morphine; Nifedipine; Darvon; Tramadol; Amoxicillin; and Amoxicillin-pot clavulanate   Social History:  The patient  reports that he quit smoking about 17 years ago. He does not have any smokeless tobacco history on file. He reports that he drinks about 0.6 oz of alcohol per week. He reports that he does not use illicit drugs.   Family History:  The patient's  family history includes Emphysema in his brother; Hypertension in his mother and other; Stroke in his brother.   RASSESSMENT AND PLAN: 1.  Malignant hypertension, uncontrolled. - Pt is currently taking 8 different classes of hypertensive medications.  He states that he is compliant with current therapy. Unfortunately there was little change in BP with switch from losartan to [place].  Two options include increasing Edarbi or adding eplerenone.  Pt would like to increase Edarbi first to help decrease pill burden.  He will also continue to get records from the trial he was evaluated for.  Depending those results, may need renal artery ultrasound to r/o secondary causes of resistant hypertension.  Also suggested he follow up with PCP regarding pain management as this may also play a role in getting to BP goal.  He states he has an appointment with him within the next month.    Signed, Aris Georgia, [address], [place], [place]  [address] Phone: [phone]; Fax: [phone]

## 2015-03-06 NOTE — Progress Notes (Signed)
Quick Note:  LVM for pt to return call ______ 

## 2015-03-15 ENCOUNTER — Encounter: Payer: Self-pay | Admitting: Cardiovascular Disease

## 2015-03-15 ENCOUNTER — Other Ambulatory Visit (INDEPENDENT_AMBULATORY_CARE_PROVIDER_SITE_OTHER): Admitting: *Deleted

## 2015-03-15 DIAGNOSIS — E785 Hyperlipidemia, unspecified: Secondary | ICD-10-CM | POA: Diagnosis not present

## 2015-03-15 DIAGNOSIS — I10 Essential (primary) hypertension: Secondary | ICD-10-CM | POA: Diagnosis not present

## 2015-03-15 LAB — BASIC METABOLIC PANEL
BUN: 9 mg/dL (ref 6–23)
CO2: 29 mEq/L (ref 19–32)
Calcium: 9.5 mg/dL (ref 8.4–10.5)
Chloride: 104 mEq/L (ref 96–112)
Creatinine, Ser: 0.75 mg/dL (ref 0.40–1.50)
GFR: 113.04 mL/min (ref >60.00)
Glucose, Bld: 160 mg/dL — ABNORMAL HIGH (ref 70–99)
POTASSIUM: 4.5 meq/L (ref 3.5–5.1)
SODIUM: 142 meq/L (ref 135–145)

## 2015-03-15 NOTE — Addendum Note (Signed)
Addended by: Eulis Foster on: 03/15/2015 07:49 AM   Modules accepted: Orders

## 2015-03-19 ENCOUNTER — Other Ambulatory Visit: Payer: Self-pay

## 2015-03-19 DIAGNOSIS — I10 Essential (primary) hypertension: Secondary | ICD-10-CM

## 2015-03-19 DIAGNOSIS — E785 Hyperlipidemia, unspecified: Secondary | ICD-10-CM

## 2015-03-19 MED ORDER — POTASSIUM CHLORIDE CRYS ER 20 MEQ PO TBCR: 20.0000 meq | EXTENDED_RELEASE_TABLET | Freq: Two times a day (BID) | ORAL | Status: DC

## 2015-03-26 ENCOUNTER — Encounter: Payer: Self-pay | Admitting: Pharmacist

## 2015-03-26 ENCOUNTER — Telehealth: Payer: Self-pay | Admitting: Neurology

## 2015-03-26 NOTE — Telephone Encounter (Signed)
Spoke to pt. Pt informs me that he has had two sleep studies done in the past. Both times, right before he went to sleep, his oxygen desaturated and he was placed on supplemental oxygen. He says that this might be why he is showing as not having OSA. He wants Dr. Brett Fairy to alter the protocol for him, so where he is not placed on oxygen during his study, so that it can be ruled out that he does not have osa even when not on oxygen. I will speak to Dr. Brett Fairy and our sleep lab manager and call him back.

## 2015-03-26 NOTE — Telephone Encounter (Signed)
Pt would like a call back regarding changing protocol on his upcoming sleep study

## 2015-03-26 NOTE — Telephone Encounter (Signed)
Returned pt's call. No answer, left a message asking him to call me back.

## 2015-03-27 ENCOUNTER — Telehealth: Payer: Self-pay

## 2015-03-27 NOTE — Telephone Encounter (Signed)
Spoke with patient and explained policy on oxygen administration. Also spoke about OSA and how administering oxygen would not change the amount of Obstructions.He understood and will proceed with study.

## 2015-03-29 ENCOUNTER — Other Ambulatory Visit: Payer: Self-pay | Admitting: Cardiovascular Disease

## 2015-04-01 ENCOUNTER — Encounter: Payer: Self-pay | Admitting: Cardiovascular Disease

## 2015-04-01 MED ORDER — AZILSARTAN MEDOXOMIL 80 MG PO TABS: 80.0000 mg | ORAL_TABLET | Freq: Every day | ORAL | Status: DC

## 2015-04-02 ENCOUNTER — Encounter: Payer: Self-pay | Admitting: Cardiovascular Disease

## 2015-04-02 ENCOUNTER — Other Ambulatory Visit: Payer: Self-pay

## 2015-04-02 ENCOUNTER — Ambulatory Visit (INDEPENDENT_AMBULATORY_CARE_PROVIDER_SITE_OTHER): Admitting: Neurology

## 2015-04-02 DIAGNOSIS — G4730 Sleep apnea, unspecified: Secondary | ICD-10-CM

## 2015-04-02 DIAGNOSIS — G4739 Other sleep apnea: Secondary | ICD-10-CM

## 2015-04-02 DIAGNOSIS — Z9981 Dependence on supplemental oxygen: Secondary | ICD-10-CM

## 2015-04-02 DIAGNOSIS — E662 Morbid (severe) obesity with alveolar hypoventilation: Secondary | ICD-10-CM

## 2015-04-02 DIAGNOSIS — R0683 Snoring: Secondary | ICD-10-CM

## 2015-04-02 DIAGNOSIS — R0902 Hypoxemia: Secondary | ICD-10-CM

## 2015-04-02 DIAGNOSIS — E874 Mixed disorder of acid-base balance: Secondary | ICD-10-CM

## 2015-04-02 DIAGNOSIS — G4710 Hypersomnia, unspecified: Secondary | ICD-10-CM

## 2015-04-02 MED ORDER — AZILSARTAN MEDOXOMIL 80 MG PO TABS: 80.0000 mg | ORAL_TABLET | Freq: Every day | ORAL | Status: DC

## 2015-04-02 NOTE — Sleep Study (Signed)
Please see the scanned sleep study interpretation located in the Procedure tab within the Chart Review section.

## 2015-04-03 ENCOUNTER — Other Ambulatory Visit: Payer: Self-pay

## 2015-04-03 DIAGNOSIS — I10 Essential (primary) hypertension: Secondary | ICD-10-CM

## 2015-04-04 ENCOUNTER — Encounter: Payer: Self-pay | Admitting: Pulmonary Disease

## 2015-04-08 ENCOUNTER — Telehealth: Payer: Self-pay

## 2015-04-08 DIAGNOSIS — G4733 Obstructive sleep apnea (adult) (pediatric): Secondary | ICD-10-CM

## 2015-04-08 NOTE — Telephone Encounter (Signed)
Spoke to pt regarding his sleep study results. Advised him that his study showed osa with hypoxemia and cpap is recommended. Pt wishes to start cpap. He is asking whether Dr. Brett Fairy wants his 2L of o2 that he is already on to be bled into the cpap. He already owns an oxygen concentrator. I will ask Dr. Brett Fairy.  Pt wishes to use [employer] for his DME. Pt's cell phone was breaking up, so he asked me to send a patient email with the information. Pt needs to call and make a 30-60 day post cpap start up appt with Dr. Brett Fairy. This was told to him in the pt email.

## 2015-04-10 ENCOUNTER — Encounter: Payer: Self-pay | Admitting: Neurology

## 2015-04-10 NOTE — Telephone Encounter (Signed)
Sent to MR.

## 2015-04-19 ENCOUNTER — Ambulatory Visit (HOSPITAL_COMMUNITY)
Admission: RE | Admit: 2015-04-19 | Discharge: 2015-04-19 | Disposition: A | Discharge status: Home / Self Care | Source: Ambulatory Visit | Attending: Cardiovascular Disease | Admitting: Cardiovascular Disease

## 2015-04-19 DIAGNOSIS — I2510 Atherosclerotic heart disease of native coronary artery without angina pectoris: Secondary | ICD-10-CM | POA: Diagnosis not present

## 2015-04-19 DIAGNOSIS — E119 Type 2 diabetes mellitus without complications: Secondary | ICD-10-CM | POA: Diagnosis not present

## 2015-04-19 DIAGNOSIS — I10 Essential (primary) hypertension: Secondary | ICD-10-CM | POA: Diagnosis not present

## 2015-04-19 DIAGNOSIS — F17200 Nicotine dependence, unspecified, uncomplicated: Secondary | ICD-10-CM | POA: Insufficient documentation

## 2015-04-19 DIAGNOSIS — I701 Atherosclerosis of renal artery: Secondary | ICD-10-CM | POA: Insufficient documentation

## 2015-04-19 DIAGNOSIS — E785 Hyperlipidemia, unspecified: Secondary | ICD-10-CM | POA: Insufficient documentation

## 2015-04-26 ENCOUNTER — Encounter: Payer: Self-pay | Admitting: Pharmacist

## 2015-04-29 ENCOUNTER — Encounter: Payer: Self-pay | Admitting: Pulmonary Disease

## 2015-04-29 ENCOUNTER — Encounter: Payer: Self-pay | Admitting: Cardiovascular Disease

## 2015-04-29 NOTE — Telephone Encounter (Signed)
Per Dr. Lake Bells, okay to [facility] on 05/10/15.  Called pt and appt was scheduled for 9/23 at 1:30. Nothing further needed

## 2015-04-29 NOTE — Telephone Encounter (Signed)
Is it possible to get a followup appt with Dr Lake Bells on Sept 22nd or 23rd? This is status post sleep study, overnight pulseoximetry, start of night-time Oxygen @ 2LPM as well as more recently starting CPAP with O2 bleed @ 2LPM. These are the only days I'm available in the next couple of months.    Donne Anon  --  Dr. Lake Bells is not on on 9/22. Can pt be worked in on 9/23 Dr. Lake Bells? thanks

## 2015-05-10 ENCOUNTER — Ambulatory Visit (INDEPENDENT_AMBULATORY_CARE_PROVIDER_SITE_OTHER): Admitting: Pulmonary Disease

## 2015-05-10 ENCOUNTER — Encounter: Payer: Self-pay | Admitting: Pulmonary Disease

## 2015-05-10 VITALS — BP 134/68 | HR 78 | Ht 69.0 in | Wt 218.0 lb

## 2015-05-10 DIAGNOSIS — G4734 Idiopathic sleep related nonobstructive alveolar hypoventilation: Secondary | ICD-10-CM | POA: Diagnosis not present

## 2015-05-10 DIAGNOSIS — G4733 Obstructive sleep apnea (adult) (pediatric): Secondary | ICD-10-CM

## 2015-05-10 NOTE — Progress Notes (Signed)
Subjective:    Patient ID: Austin Florida, PA, male    DOB: Feb 05, 1956, 59 y.o.   MRN: [id number]  Synopsis: referred for nocturnal hypoxemia in 2016 01/2015 Echo> RVSP 35, normal RV size and function, LV with mild concentric hypertrophy, normal LVEF 01/2015 ONO on Oxygen 2LPM> < 90% only 0.6% of time July 2016 pulmonary function testing ratio 87%, FEV1 3.16 L (90% predicted), total lung capacity 5.49 L (80% predicted), DLCO 32.47 (104% predicted).  HPI Chief Complaint  Patient presents with  . Follow-up    review sleep study results.     Austin [name] was recently found to have obstructive sleep apnea with a sleep study that showed an AHI in the 60s when he was supine and 29 when nonsupine. His O2 saturation nadir was 76% He says that he's been having some fatigue and some mild shortness of breath but this hasn't changed much. He's attributed that to old age. He denies cough. He says he has been sleeping quite a bit better since using CPAP and is not waking up nearly as much at night. He is here mostly to go over these results. He denies chest pain or cough. He's been taking anxiety medications and he notes that he took Klonopin today.  Past Medical History  Diagnosis Date  . CAD (coronary artery disease)     a. LHC 10/11:  LAD 40-50%; CFX 30%; OM2 30%; RCA 70-80% (tx with DES), 50-60% distal/ Promus DES to RCA 06/05/2010 [facility] trial)/ EF 60% on cath;  b.  Myoview 2/12: No ischemia, mild apical thinning, EF 55%;   c.  Echo 4/11: Mild focal basal and mild concentric hypertrophy the septum, EF 41-63%, grade 2 diastolic dysfunction, mild LAE  . Pancreatitis   . Hyperlipidemia   . Hypertension   . 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 Obesity   . Carotid stenosis 02/2007    a.  0-39% bilateral; b. dopplers 05/2010: 84-53% RICA; 6-46% LICA;  c. dopplers 03/320:  40-59% bilat ICA (repeat 1 year)  . Bell's palsy 07/2009  . Insomnia    . PTSD (post-traumatic stress disorder)   . Dyspnea 05/2008    Myoview; TM/myoview 63% no ischemia/ scar  . Diabetes mellitus     SINCE 2004  . Seasonal allergies       Review of Systems     Objective:   Physical Exam Filed Vitals:   05/10/15 1325  BP: 134/68  Pulse: 78  Height: _0  (1.753 m)  Weight: 218 lb (98.884 kg)  SpO2: 97%   RA  Gen: well appearing HENT: OP clear, TM's clear, neck supple PULM: CTA B, normal percussion CV: RRR, no mgr, trace edema GI: BS+, soft, nontender Derm: no cyanosis or rash Psyche: normal mood and affect         Assessment & Plan:  OSA (obstructive sleep apnea) He was found to have obstructive sleep apnea with a recent split night study. He has been doing well with CPAP therapy and he says that his symptoms have improved significantly. He notes that he has more significant sleep apnea symptoms when lying on his back.   Plan: Continue CPAP with 2 L of oxygen bleed in Continue follow-up with sleep specialist     Nocturnal hypoxemia I think his nocturnal hypoxemia was due primarily to obstructive sleep apnea. We saw that he had mild LVH and a slightly elevated RVSP on his echocardiogram. This  is only suggestive of pulmonary hypertension which in this case of a most likely related to his nocturnal hypoxemia which is now treated with CPAP and oxygen. He has a lot of questions as to whether or not his dive time when he was in [place] or some of the exposures he had while in the [place] have anything to do with his lungs. I explained to him that based on his normal lung exam, completely normal pulmonary function testing, and normal chest x-ray that I cannot find evidence of an underlying lung disease. However, I explained that there is an expert up the road at [facility] named Ronald Lobo who is a pulmonologist to spend a significant amount of time in the [place] and running the hyperbaric chamber at [facility]. If he is interested I  could make a referral to discuss these issues further. At this time he prefer not to.  Greater than 25 minutes for spent answering his questions today in clinic.     Current outpatient prescriptions:  .  amLODipine-benazepril (LOTREL) 10-40 MG per capsule, TAKE 1 CAPSULE DAILY, Disp: 90 capsule, Rfl: 1 .  Ascorbic Acid (VITAMIN C) 1000 MG tablet, Take 1,000 mg by mouth daily., Disp: , Rfl:  .  aspirin 81 MG tablet, Take 81 mg by mouth daily., Disp: , Rfl:  .  atorvastatin (LIPITOR) 20 MG tablet, TAKE 1 TABLET (20 MG TOTAL) DAILY AT 6 P.M., Disp: 90 tablet, Rfl: 1 .  Azilsartan Medoxomil (EDARBI) 80 MG TABS, Take 1 tablet (80 mg total) by mouth daily., Disp: 30 tablet, Rfl: 1 .  B Complex-C (SUPER B COMPLEX PO), Take 1 tablet by mouth daily.  , Disp: , Rfl:  .  Canagliflozin (INVOKANA) 300 MG TABS, Take 1 tablet by mouth daily with breakfast., Disp: , Rfl:  .  Cholecalciferol (VITAMIN D3) 10000 UNITS TABS, Take 5,000 Units by mouth daily., Disp: , Rfl:  .  clonazePAM (KLONOPIN) 0.5 MG tablet, Take 0.5 mg by mouth 2 (two) times daily., Disp: , Rfl:  .  cloNIDine (CATAPRES) 0.2 MG tablet, TAKE 1 TABLET 6 TIMES DAILY, Disp: 540 tablet, Rfl: 1 .  Coenzyme Q10 (COQ-10) 400 MG CAPS, Take 400 mg by mouth daily., Disp: , Rfl:  .  COREG CR 40 MG 24 hr capsule, TAKE 1 CAPSULE DAILY, Disp: 90 capsule, Rfl: 1 .  furosemide (LASIX) 40 MG tablet, TAKE 1 TABLET DAILY, Disp: 90 tablet, Rfl: 1 .  glyBURIDE-metformin (GLUCOVANCE) 2.5-500 MG per tablet, Take 2 tablets by mouth 2 (two) times daily.  , Disp: , Rfl:  .  insulin aspart protamine-insulin aspart (NOVOLOG MIX 70/30) (70-30) 100 UNIT/ML injection, Inject 20-75 Units into the skin 3 (three) times daily with meals. Sliding scale. Pt will take between 20 -75 units depending on what he eats and sugar levels, i.e. Toast 20 units, i.e. [place] fries 75 units, Disp: , Rfl:  .  levocetirizine (XYZAL) 5 MG tablet, Take 5 mg by mouth every evening., Disp: , Rfl:    .  milk thistle 175 MG tablet, Take 175 mg by mouth daily., Disp: , Rfl:  .  minoxidil (LONITEN) 10 MG tablet, TAKE 2 TABLETS (20 MG TOTAL) TWO TIMES DAILY, Disp: 360 tablet, Rfl: 2 .  nitroGLYCERIN (NITROSTAT) 0.4 MG SL tablet, Place 1 tablet (0.4 mg total) under the tongue every 5 (five) minutes as needed. For chest pain, Disp: 25 tablet, Rfl: 3 .  Omega-3 Fatty Acids (FISH OIL) 1200 MG CAPS, Take 1 capsule by  mouth daily. , Disp: , Rfl:  .  potassium chloride SA (K-DUR,KLOR-CON) 20 MEQ tablet, Take 1 tablet (20 mEq total) by mouth 2 (two) times daily., Disp: 180 tablet, Rfl: 3 .  Turmeric 500 MG CAPS, Take 1 capsule by mouth daily., Disp: , Rfl:

## 2015-05-10 NOTE — Assessment & Plan Note (Signed)
I think his nocturnal hypoxemia was due primarily to obstructive sleep apnea. We saw that he had mild LVH and a slightly elevated RVSP on his echocardiogram. This is only suggestive of pulmonary hypertension which in this case of a most likely related to his nocturnal hypoxemia which is now treated with CPAP and oxygen. He has a lot of questions as to whether or not his dive time when he was in [place] or some of the exposures he had while in the [place] have anything to do with his lungs. I explained to him that based on his normal lung exam, completely normal pulmonary function testing, and normal chest x-ray that I cannot find evidence of an underlying lung disease. However, I explained that there is an expert up the road at [facility] named Ronald Lobo who is a pulmonologist to spend a significant amount of time in the [place] and running the hyperbaric chamber at [facility]. If he is interested I could make a referral to discuss these issues further. At this time he prefer not to.  Greater than 25 minutes for spent answering his questions today in clinic.

## 2015-05-10 NOTE — Patient Instructions (Signed)
Keep using CPAP as you are doing Exercise regularly Let me know if shortness of breath gets worse Let me know if you want to see Dr. Grace Bushy We will see you back if shortness of breath worsens

## 2015-05-10 NOTE — Assessment & Plan Note (Signed)
He was found to have obstructive sleep apnea with a recent split night study. He has been doing well with CPAP therapy and he says that his symptoms have improved significantly. He notes that he has more significant sleep apnea symptoms when lying on his back.   Plan: Continue CPAP with 2 L of oxygen bleed in Continue follow-up with sleep specialist

## 2015-05-13 ENCOUNTER — Encounter: Payer: Self-pay | Admitting: Neurology

## 2015-05-31 ENCOUNTER — Encounter: Payer: Self-pay | Admitting: Cardiovascular Disease

## 2015-06-06 NOTE — Progress Notes (Signed)
Cardiology Office Note Date:  06/09/2015   ID:  Austin Florida, PA, DOB 1955/10/08, MRN [id number]  PCP:  Marton Redwood, MD  Cardiologist:  Sherren Mocha, MD    Chief Complaint  Patient presents with  . Pre-op Exam   History of Present Illness: Austin LIPKE, PA is a 59 y.o. male who presents for follow-up of coronary artery disease and hypertension. The patient has coronary artery disease and underwent stenting of the right coronary artery in 2011 after presenting with atypical angina. He has severe hypertension and is on multiple medications and is maintained on multiple antihypertensive medications. He's been followed recently in our Hypertension Clinic.  Reports no changes in symptoms of late. Continues to struggle with fatigue, somnolence as side effects of antihypertensive agents. Has to take clonidine up to 6 times daily. He has headaches when BP is elevated. No chest pain or pressure. No edema, orthopnea, or PND. He does admit to dyspnea with exertion, but no recent change. He is compliant with his complex medical regimen. Will require umbilical hernia surgery, but plans to put this off until after the first of next year because his wife is going to have back surgery in the near future.  Past Medical History  Diagnosis Date  . CAD (coronary artery disease)     a. LHC 10/11:  LAD 40-50%; CFX 30%; OM2 30%; RCA 70-80% (tx with DES), 50-60% distal/ Promus DES to RCA 06/05/2010 [facility] trial)/ EF 60% on cath;  b.  Myoview 2/12: No ischemia, mild apical thinning, EF 55%;   c.  Echo 4/11: Mild focal basal and mild concentric hypertrophy the septum, EF 10-31%, grade 2 diastolic dysfunction, mild LAE  . Pancreatitis   . Hyperlipidemia   . Hypertension   . 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 Obesity   . Carotid stenosis 02/2007    a.  0-39% bilateral; b. dopplers 05/2010: 59-45% RICA; 8-59% LICA;  c. dopplers 09/9242:   40-59% bilat ICA (repeat 1 year)  . Bell's palsy 07/2009  . Insomnia   . PTSD (post-traumatic stress disorder)   . Dyspnea 05/2008    Myoview; TM/myoview 63% no ischemia/ scar  . Diabetes mellitus     SINCE 2004  . Seasonal allergies     Past Surgical History  Procedure Laterality Date  . Cardiac catheterization      LAD 40-50%; CFX 30%; OM2 30%; RCA 70-80% (tx with DES), 50-60% distal  . Lumbar disc surgery      C5-6  . Appendectomy    . Tonsillectomy    . Cervical spine surgery      2006  . Cholecystectomy  09/16/2012    Procedure: LAPAROSCOPIC CHOLECYSTECTOMY WITH INTRAOPERATIVE CHOLANGIOGRAM;  Surgeon: Odis Hollingshead, MD;  Location: [facility];  Service: General;  Laterality: N/A;    Current Outpatient Prescriptions  Medication Sig Dispense Refill  . amLODipine-benazepril (LOTREL) 10-40 MG capsule Take 1 capsule by mouth daily.    . Ascorbic Acid (VITAMIN C) 1000 MG tablet Take 1,000 mg by mouth daily.    Marland Kitchen aspirin 81 MG tablet Take 81 mg by mouth daily.    Marland Kitchen atorvastatin (LIPITOR) 20 MG tablet Take 20 mg by mouth daily.    . Azilsartan Medoxomil (EDARBI) 80 MG TABS Take 1 tablet (80 mg total) by mouth daily. 30 tablet 1  . B Complex-C (SUPER B COMPLEX PO) Take 1 tablet by mouth daily.      Marland Kitchen  Canagliflozin (INVOKANA) 300 MG TABS Take 1 tablet by mouth daily with breakfast.    . Cholecalciferol (VITAMIN D3) 10000 UNITS TABS Take 5,000 Units by mouth daily.    . clonazePAM (KLONOPIN) 0.5 MG tablet Take 0.5 mg by mouth 2 (two) times daily.    . cloNIDine (CATAPRES) 0.2 MG tablet Take 0.2 mg by mouth 6 (six) times daily.    . Coenzyme Q10 (COQ-10) 400 MG CAPS Take 400 mg by mouth daily.    . furosemide (LASIX) 40 MG tablet Take 40 mg by mouth daily.    Marland Kitchen glyBURIDE-metformin (GLUCOVANCE) 2.5-500 MG per tablet Take 2 tablets by mouth 2 (two) times daily.      . insulin aspart protamine- aspart (NOVOLOG MIX 70/30) (70-30) 100 UNIT/ML injection Inject 15-30 Units into the skin daily  as needed (pt only uses for real sugar or carbs, at most once a month injection.).    Marland Kitchen levocetirizine (XYZAL) 5 MG tablet Take 5 mg by mouth every evening.    . milk thistle 175 MG tablet Take 175 mg by mouth daily.    . minoxidil (LONITEN) 10 MG tablet Take 20 mg by mouth 2 (two) times daily.    . nitroGLYCERIN (NITROSTAT) 0.4 MG SL tablet Place 0.4 mg under the tongue every 5 (five) minutes as needed for chest pain (3 doses MAX).    . Omega-3 Fatty Acids (FISH OIL) 1200 MG CAPS Take 1 capsule by mouth daily.     . potassium chloride SA (K-DUR,KLOR-CON) 20 MEQ tablet Take 1 tablet (20 mEq total) by mouth 2 (two) times daily. 180 tablet 3  . Turmeric 500 MG CAPS Take 1 capsule by mouth daily.    Marland Kitchen labetalol (NORMODYNE) 200 MG tablet Take 1 tablet (200 mg total) by mouth 2 (two) times daily. 180 tablet 3   No current facility-administered medications for this visit.    Allergies:   Morphine; Nifedipine; Darvon; Tramadol; Amoxicillin; and Amoxicillin-pot clavulanate   Social History:  The patient  reports that he quit smoking about 17 years ago. He has quit using smokeless tobacco. He reports that he drinks about 0.6 oz of alcohol per week. He reports that he does not use illicit drugs.   Family History:  The patient's  family history includes Emphysema in his brother; Hypertension in his mother and other; Stroke in his brother.    ROS:  Please see the history of present illness.  Otherwise, review of systems is positive for hearing loss, dyspnea with exertion, back pain, snoring, anxiety, and headaches.  All other systems are reviewed and negative.    PHYSICAL EXAM: VS:  BP 160/90 mmHg  Pulse 80  Ht _0  (1.753 m)  Wt 215 lb 3.2 oz (97.614 kg)  BMI 31.76 kg/m2 , BMI Body mass index is 31.76 kg/(m^2). GEN: Well nourished, well developed, in no acute distress HEENT: normal Neck: no JVD, no masses. No carotid bruits Cardiac: RRR without murmur or gallop                Respiratory:   clear to auscultation bilaterally, normal work of breathing GI: soft, nontender, nondistended, + BS MS: no deformity or atrophy Ext: no pretibial edema, pedal pulses 2+= bilaterally Skin: warm and dry, no rash Neuro:  Strength and sensation are intact Psych: euthymic mood, full affect  EKG:  EKG is not ordered today.  Recent Labs: 03/15/2015: BUN 9; Creatinine, Ser 0.75; Potassium 4.5; Sodium 142   Lipid Panel  Component Value Date/Time   CHOL 111 09/19/2007 0850   TRIG 102 09/19/2007 0850   HDL 29.3* 09/19/2007 0850   CHOLHDL 3.8 CALC 09/19/2007 0850   VLDL 20 09/19/2007 0850   LDLCALC 61 09/19/2007 0850      Wt Readings from Last 3 Encounters:  06/07/15 215 lb 3.2 oz (97.614 kg)  05/10/15 218 lb (98.884 kg)  02/05/15 220 lb (99.791 kg)     Cardiac Studies Reviewed: 2D Echo 02/01/2015: Study Conclusions - Left ventricle: The cavity size was normal. There was mild concentric hypertrophy. Systolic function was normal. The estimated ejection fraction was in the range of 55% to 60%. Wall motion was normal; there were no regional wall motion abnormalities. - Left atrium: The atrium was mildly dilated. - Atrial septum: No defect or patent foramen ovale was identified. - Pulmonary arteries: PA peak pressure: 35 mm Hg (S). - Pericardium, extracardiac: A trivial pericardial effusion was identified.  ASSESSMENT AND PLAN: 1.  Malignant HTN, uncontrolled. Reviewed non-pharmacologic strategies such as sodium restriction with the patient. Discussed consideration of a tertiary center but he isn't interested. Reviewed meds at length, as well as previous intolerances such as spironolactone. Will switch carvedilol to labetalol 200 mg BID which can be titrated to 400 mg BID as needed. FU HTN Clinic in 8 weeks.  2. CAD, native vessel: no anginal symptoms. Continue current Rx.  3. Hyperlipidemia: treated with atorvastatin. Followed by Dr Brigitte Pulse.   4. Carotid stenosis without  hx of TIA/stroke: due for repeat duplex scan - will arrange in November at his request  5. Preop Exam: umbilical hernia surgery. Will check EKG when he comes in for duplex scan (he declined to have this done today). He is at low risk of proceeding directly with surgery without further testing. Primary issue is control of BP and will need to take oral meds prior to surgery possible with some IV coverage per anesthesia at time of surgery.  Current medicines are reviewed with the patient today.  The patient does not have concerns regarding medicines.  Labs/ tests ordered today include:  No orders of the defined types were placed in this encounter.    Disposition:   FU as directed  Signed, Sherren Mocha, MD  06/09/2015 10:04 PM    [address], [place], [place]  [address] Phone: [phone]; Fax: [phone]

## 2015-06-07 ENCOUNTER — Ambulatory Visit (INDEPENDENT_AMBULATORY_CARE_PROVIDER_SITE_OTHER): Admitting: Cardiovascular Disease

## 2015-06-07 ENCOUNTER — Encounter: Payer: Self-pay | Admitting: Cardiovascular Disease

## 2015-06-07 VITALS — BP 160/90 | HR 80 | Ht 69.0 in | Wt 215.2 lb

## 2015-06-07 DIAGNOSIS — I10 Essential (primary) hypertension: Secondary | ICD-10-CM

## 2015-06-07 DIAGNOSIS — I2510 Atherosclerotic heart disease of native coronary artery without angina pectoris: Secondary | ICD-10-CM | POA: Diagnosis not present

## 2015-06-07 DIAGNOSIS — I739 Peripheral vascular disease, unspecified: Principal | ICD-10-CM

## 2015-06-07 DIAGNOSIS — I779 Disorder of arteries and arterioles, unspecified: Secondary | ICD-10-CM | POA: Diagnosis not present

## 2015-06-07 MED ORDER — LABETALOL HCL 200 MG PO TABS: 200.0000 mg | ORAL_TABLET | Freq: Two times a day (BID) | ORAL | Status: DC

## 2015-06-07 NOTE — Patient Instructions (Signed)
Medication Instructions:  Your physician has recommended you make the following change in your medication:  1. STOP Coreg CR 2. START Labetalol 282m take one tablet by mouth twice a day  Labwork: No new orders.   Testing/Procedures: Your physician has requested that you have a carotid duplex the week of Thanksgiving. This test is an ultrasound of the carotid arteries in your neck. It looks at blood flow through these arteries that supply the brain with blood. Allow one hour for this exam. There are no restrictions or special instructions.  Your physician recommends that you schedule a follow-up appointment the week of Thanksgiving for EKG (Nurse Visit)  Follow-Up: Your physician recommends that you schedule a follow-up appointment in: [id number] with Hypertension Clinic  Your physician wants you to follow-up in: 6 MONTHS with Dr CBurt Knack  You will receive a reminder letter in the mail two months in advance. If you don't receive a letter, please call our office to schedule the follow-up appointment.    Any Other Special Instructions Will Be Listed Below (If Applicable).

## 2015-06-09 ENCOUNTER — Encounter: Payer: Self-pay | Admitting: Cardiovascular Disease

## 2015-06-11 ENCOUNTER — Ambulatory Visit (INDEPENDENT_AMBULATORY_CARE_PROVIDER_SITE_OTHER): Admitting: Neurology

## 2015-06-11 ENCOUNTER — Encounter: Payer: Self-pay | Admitting: Neurology

## 2015-06-11 VITALS — BP 160/82 | HR 88 | Resp 20 | Ht 69.0 in | Wt 220.0 lb

## 2015-06-11 DIAGNOSIS — I272 Other secondary pulmonary hypertension: Secondary | ICD-10-CM | POA: Diagnosis not present

## 2015-06-11 DIAGNOSIS — G4733 Obstructive sleep apnea (adult) (pediatric): Secondary | ICD-10-CM | POA: Diagnosis not present

## 2015-06-11 DIAGNOSIS — Z9989 Dependence on other enabling machines and devices: Principal | ICD-10-CM

## 2015-06-11 DIAGNOSIS — G4730 Sleep apnea, unspecified: Secondary | ICD-10-CM | POA: Diagnosis not present

## 2015-06-11 DIAGNOSIS — G4736 Sleep related hypoventilation in conditions classified elsewhere: Secondary | ICD-10-CM

## 2015-06-11 DIAGNOSIS — R0901 Asphyxia: Secondary | ICD-10-CM | POA: Diagnosis not present

## 2015-06-11 DIAGNOSIS — F4312 Post-traumatic stress disorder, chronic: Secondary | ICD-10-CM | POA: Diagnosis not present

## 2015-06-11 DIAGNOSIS — I2723 Pulmonary hypertension due to lung diseases and hypoxia: Secondary | ICD-10-CM | POA: Insufficient documentation

## 2015-06-11 DIAGNOSIS — G4710 Hypersomnia, unspecified: Secondary | ICD-10-CM

## 2015-06-11 NOTE — Patient Instructions (Signed)

## 2015-06-11 NOTE — Progress Notes (Signed)
SLEEP MEDICINE CLINIC   Provider:  Larey Seat, M D  Referring Provider: Marton Redwood, MD Primary Care Physician:  Marton Redwood, MD  Chief Complaint  Patient presents with  . Follow-up    reports that the cpap is helping, must have the cpap to go to sleep, rm 11, alone    HPI:  Austin [name], [place] is a 59 y.o. male ,seen here as a referral  from Dr. 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 Lutricia [name] for a evaluation of nocturnal hypoxemia. Austin [name] is a Caucasian left-handed male Ht 22 has recently also undergone an echocardiogram. The patient was recommended to undergo a polysomnography because he had a slight elevated pulmonary blood pressure. The nodes were recorded by Dr. Audrie Lia. He underwent an overnight pulse oximetry through his primary care office and this showed tachybradycardia arrhythmia the lowest pulse was in borderline region of 50 bpm but the highest pulse was 113 bpm at night. He also spent a total  time in hypoxemia , he was for 297 minutes at night at or below 89%  02 saturation and for 218 minutes at or below 88% saturation.  He was placed on oxygen after the study was obtained on 12-09-14 since then he had a repeat pulse oximetry on oxygen, he also had a Holter monitor both last last week and the results are not yet known to him. With that he had 2 previous sleep studies that did not show central or obstructive sleep apnea. 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 Austin [name] has been diagnosed with a mild pulmonary hypertension but he has thus far failed to ever test positive for sleep apnea.  He reports snoring he has lost  weight the last time I saw the patient was 7 years ago.  Interval history from 06-11-15, The patient underwent a split-night polysomnography on 04-02-15. Until this  sleep study was obtained, the patient had never been found to have sleep apnea- however this time his AHI was 34.4, his RDI which includes arousals from snoring, was 38.5. In supine sleep his apnea index increased to 60 per hour and in REM sleep to 41.3.  The oxygen nadir for the study was 74% and he states began 113 minutes in desaturation. Interestingly his heart rate remained very stable the average was 74 bpm in sinus rhythm. CPAP was initiated at 5 and step-by-step increased to 14 cm water. At this setting an AHI of 0.0 was reached. There were 10 central apneas induced during the CPAP titration. Oxygen desaturation was still evident with 83% nadir during the titration and 198 minutes of total time in desaturation. Periodic arousal index was 2.0. Heart rate again states stable this time at 68 beats per minutes on average. The study was interpreted as revealing by now a rather severe obstructive sleep apnea with REM accentuation and accentuation in supine sleep and associated with hypoxemia.  CPAP at 14 cm erased the AHI and the nadir of oxygen increased to 89% by the patient slept in supine position. Abdomen me rash quadruple fullface mask in medium size was used with heated humidity. We're following up today on this setting and the use of the machine for compliance.  Data obtained by my office on 06-05-15 revealed 100% of days of use and 97% compliance for over 4 hours  of consecutive use at night average user time is 7 hours 8 minutes. This CPAP is an AUTO setting, and revealed a 95% pressure of 11.1 cm water. The patient used O2 at 2 L with nasal cannula prior to the sleep study which confirmed the presence of OSA as well as hypoxemia. The patient has been become very familiar with his CPAP and actually now feels that he sleeps so much better that she craves the CPAP to some degree. He still bleeds 2 L of oxygen into the CPAP which and delivers it. He uses a full face mask by choice. I compared the data  of his sleep study also was his last overnight pulse oximetry which was performed as an ONO without CPAP  on 2 L of oxygen on 01-27-15.  Here the patient had a desaturation time at or below 90% SPO2 of only 1 minute and 12 seconds.  The patient reports that his sleep habits have changed under the CPAP therapy he often goes to bed earlier now he no longer sleeps in his recliner but actually goes to the bedroom and uses a CPAP. He feels that he sleeps longer and sounder. When he wakes up in the morning he is more refreshed and restored. He still reports nightmarish cream content sometimes waking up diaphoretic sometimes also with palpitations. This however has been less frequent since he is on CPAP and he feels that the sounder sleep is also reflected in a decrease in PT ST symptoms at night.  Today's Epworth sleepiness score was still endorsed at 13 points which is elevated his most sleepy time is after lunch. He endorsed to points for sitting and reading, watching TV or when he is a passenger in a car. Only 1 point for  sitting inactive in a public place talking to someone sitting quietly after lunch or when driving and waiting at a stop light.    Review of Systems: Out of a complete 14 system review, the patient complains of only the following symptoms, and all other reviewed systems are negative.  snoring, weight gain, PTSD interrupting his sleep with nightmares. Bells palsy - right eye droop.  Pulmonary hypertension.   Epworth score now 21 , Fatigue severity score 39  , depression score n/a    Social History   Social History  . Marital Status: Married    Spouse Name: N/A  . Number of Children: N/A  . Years of Education: N/A   Occupational History  . PA     [employer]  . Retired    Social History Main Topics  . Smoking status: Former Smoker    Quit date: 01/21/1998  . Smokeless tobacco: Former Systems developer  . Alcohol Use: 0.6 oz/week    1 Standard drinks or equivalent per  week  . Drug Use: No  . Sexual Activity: Not on file   Other Topics Concern  . Not on file   Social History Narrative   Disabled   Married   Former Scientist, clinical (histocompatibility and immunogenetics)   Regular exercise    Family History  Problem Relation Age of Onset  . Hypertension Other   . Hypertension Mother   . Emphysema Brother   . Stroke Brother     Past Medical History  Diagnosis Date  . CAD (coronary artery disease)     a. LHC 10/11:  LAD 40-50%; CFX 30%; OM2 30%; RCA 70-80% (tx with DES), 50-60% distal/ Promus DES to RCA 06/05/2010 [facility] trial)/ EF 60% on cath;  b.  Myoview 2/12: No ischemia, mild apical thinning, EF 55%;   c.  Echo 4/11: Mild focal basal and mild concentric hypertrophy the septum, EF 81-19%, grade 2 diastolic dysfunction, mild LAE  . Pancreatitis   . Hyperlipidemia   . Hypertension   . 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 Obesity   . Carotid stenosis 02/2007    a.  0-39% bilateral; b. dopplers 05/2010: 14-78% RICA; 2-95% LICA;  c. dopplers 01/2129:  40-59% bilat ICA (repeat 1 year)  . Bell's palsy 07/2009  . Insomnia   . PTSD (post-traumatic stress disorder)   . Dyspnea 05/2008    Myoview; TM/myoview 63% no ischemia/ scar  . Diabetes mellitus     SINCE 2004  . Seasonal allergies     Past Surgical History  Procedure Laterality Date  . Cardiac catheterization      LAD 40-50%; CFX 30%; OM2 30%; RCA 70-80% (tx with DES), 50-60% distal  . Lumbar disc surgery      C5-6  . Appendectomy    . Tonsillectomy    . Cervical spine surgery      2006  . Cholecystectomy  09/16/2012    Procedure: LAPAROSCOPIC CHOLECYSTECTOMY WITH INTRAOPERATIVE CHOLANGIOGRAM;  Surgeon: Odis Hollingshead, MD;  Location: [facility];  Service: General;  Laterality: N/A;    Current Outpatient Prescriptions  Medication Sig Dispense Refill  . amLODipine-benazepril (LOTREL) 10-40 MG capsule Take 1 capsule by mouth daily.    . Ascorbic Acid (VITAMIN C) 1000  MG tablet Take 1,000 mg by mouth daily.    Marland Kitchen aspirin 81 MG tablet Take 81 mg by mouth daily.    Marland Kitchen atorvastatin (LIPITOR) 20 MG tablet Take 20 mg by mouth daily.    . Azilsartan Medoxomil (EDARBI) 80 MG TABS Take 1 tablet (80 mg total) by mouth daily. 30 tablet 1  . B Complex-C (SUPER B COMPLEX PO) Take 1 tablet by mouth daily.      . Canagliflozin (INVOKANA) 300 MG TABS Take 1 tablet by mouth daily with breakfast.    . Cholecalciferol (VITAMIN D3) 10000 UNITS TABS Take 5,000 Units by mouth daily.    . clonazePAM (KLONOPIN) 0.5 MG tablet Take 0.5 mg by mouth 2 (two) times daily.    . cloNIDine (CATAPRES) 0.2 MG tablet Take 0.2 mg by mouth 6 (six) times daily.    . Coenzyme Q10 (COQ-10) 400 MG CAPS Take 400 mg by mouth daily.    . furosemide (LASIX) 40 MG tablet Take 40 mg by mouth daily.    Marland Kitchen glyBURIDE-metformin (GLUCOVANCE) 2.5-500 MG per tablet Take 2 tablets by mouth 2 (two) times daily.      . insulin aspart protamine- aspart (NOVOLOG MIX 70/30) (70-30) 100 UNIT/ML injection Inject 15-30 Units into the skin daily as needed (pt only uses for real sugar or carbs, at most once a month injection.).    Marland Kitchen labetalol (NORMODYNE) 200 MG tablet Take 1 tablet (200 mg total) by mouth 2 (two) times daily. 180 tablet 3  . levocetirizine (XYZAL) 5 MG tablet Take 5 mg by mouth every evening.    . milk thistle 175 MG tablet Take 175 mg by mouth daily.    . minoxidil (LONITEN) 10 MG tablet Take 20 mg by mouth 2 (two) times daily.    . nitroGLYCERIN (NITROSTAT) 0.4 MG SL tablet Place 0.4 mg under the tongue every 5 (five) minutes as needed for chest pain (3 doses MAX).    Marland Kitchen  Omega-3 Fatty Acids (FISH OIL) 1200 MG CAPS Take 1 capsule by mouth daily.     . potassium chloride SA (K-DUR,KLOR-CON) 20 MEQ tablet Take 1 tablet (20 mEq total) by mouth 2 (two) times daily. 180 tablet 3  . Turmeric 500 MG CAPS Take 1 capsule by mouth daily.     No current facility-administered medications for this visit.    Allergies  as of 06/11/2015 - Review Complete 06/11/2015  Allergen Reaction Noted  . Morphine Other (See Comments)   . Nifedipine Other (See Comments)   . Darvon [propoxyphene] Itching 09/01/2012  . Tramadol Other (See Comments) 01/15/2012  . Amoxicillin Hives and Rash   . Amoxicillin-pot clavulanate Hives and Rash     Vitals: BP 160/82 mmHg  Pulse 88  Resp 20  Ht _0  (1.753 m)  Wt 220 lb (99.791 kg)  BMI 32.47 kg/m2 Last Weight:  Wt Readings from Last 1 Encounters:  06/11/15 220 lb (99.791 kg)       Last Height:   Ht Readings from Last 1 Encounters:  06/11/15 _1  (1.753 m)    Physical exam:  General: The patient is awake, alert and appears not in acute distress. The patient is well groomed. Head: Normocephalic, atraumatic. Neck is supple. Mallampati 3 ,  the left now the on is more narrowed than the right but appears patent, the mucous lining is reddened and swollen.  neck circumference:22. Nasal airflow as above  TMJ is not  evident . Retrognathia is not seen.  Cardiovascular:  Regular rate and rhythm , without  murmurs or carotid bruit, and without distended neck veins. Respiratory: Lungs are clear to auscultation. Skin:  Without evidence of edema, or rash Trunk: BMI is  elevated and patient has normal posture. Anterior cervical fusion patient , low ROM in the neck and back pain.   Neurologic exam : The patient is awake and alert, oriented to place and time.   Memory subjective described as intact. There is a normal attention span & concentration ability.  Speech is fluent without  dysarthria, dysphonia or aphasia. Mood and affect are appropriate.  Cranial nerves: Pupils are equal and briskly reactive to light. Funduscopic exam without evidence of pallor or edema.  Extraocular movements  in vertical and horizontal planes intact and without nystagmus. Visual fields by finger perimetry are intact. Hearing to finger rub intact.  Facial sensation intact to fine touch. Facial motor  strength is symmetric and tongue and uvula move midline.  Motor exam:  Normal tone, muscle bulk and symmetric ,strength in all extremities. Sensory:  Fine touch, pinprick and vibration were tested in all extremities. Proprioception is normal. Coordination: Rapid alternating movements in the fingers/hands is normal.  Finger-to-nose maneuver normal without evidence of ataxia, dysmetria or tremor. Gait and station: Patient walks without assistive device and is able unassisted to climb up to the exam table. Strength within normal limits. Stance is stable and normal.  Deep tendon reflexes: in the  upper and lower extremities are symmetric and intact. Babinski maneuver response is downgoing.   Assessment:  After physical and neurologic examination, review of laboratory studies, imaging, neurophysiology testing and pre-existing records, assessment is :   1) OSA, severe with positional and REM accentuation.   2)  Hypoxemia - appears resolved on O2 and now on CPAP with O2 . He is excessively daytime sleepy by Epworth score, is fatigued and he carries from years ago the diagnosis of PTSD has frequent nightmares and often fragmented sleep. Based  on this we will do an attended sleep study.  3)  PTSD, reflected in the nightmares and diaphoretic arousals. Flash backs in daytime and at night.   The patient was advised of the nature of the diagnosed sleep disorder , the treatment options and risks for general a health and wellness arising from not treating the condition. Visit duration was 30 minutes. More than 505 of the face to face time was dedicated to the coordination of care with internist, pulmonologist ( Dr. Lake Bells ) , and [place] .   Plan:  Treatment plan and additional workup :  I would like for Mr. [name] to continue with CPAP use and he seems to be agreeable to this. CPAP has already improved his daytime sleepiness and fatigue, it had also a positive effect on some of his PTSD related sleep symptoms.  The delivery of oxygen through the CPAP allows for wearing a full face mask without any additional holes or nasal cannula. He has been able to obtain 7-8 hours of nocturnal sleep which also should benefit his pulmonary hypertension, as well as his hypoxemia and his obstructive sleep apnea. I offered the patient today to write a letter to the [employer] and to summarize the results of the previous sleep studies, the overnight pulse oximetry, the diagnosis of pulmonary hypertension, his need for CPAP and oxygen use and the effect of PTSD, which is a service related disability, on his sleep pattern.  Rv in 6 month.     Austin Partridge Elexis Pollak MD  06/11/2015  Cc  Carmie Kanner, MD

## 2015-06-16 ENCOUNTER — Encounter: Payer: Self-pay | Admitting: Cardiovascular Disease

## 2015-06-23 ENCOUNTER — Other Ambulatory Visit: Payer: Self-pay | Admitting: Cardiovascular Disease

## 2015-06-24 ENCOUNTER — Encounter: Payer: Self-pay | Admitting: Cardiovascular Disease

## 2015-06-25 ENCOUNTER — Telehealth: Payer: Self-pay | Admitting: Cardiovascular Disease

## 2015-06-25 NOTE — Telephone Encounter (Signed)
Pt calling stating he has left several my chart messages and hasn't gotten a response, after taking his info I read his last message and saw that you indeed answered his message as of 851am today, he apparently hasn't seen it and would like a response by [employer]

## 2015-06-25 NOTE — Telephone Encounter (Signed)
Message sent to the pt through My Chart.

## 2015-06-26 ENCOUNTER — Encounter: Payer: Self-pay | Admitting: Cardiovascular Disease

## 2015-06-27 ENCOUNTER — Encounter: Payer: Self-pay | Admitting: Cardiovascular Disease

## 2015-06-30 ENCOUNTER — Other Ambulatory Visit: Payer: Self-pay | Admitting: Cardiovascular Disease

## 2015-07-08 ENCOUNTER — Encounter

## 2015-07-08 ENCOUNTER — Encounter (HOSPITAL_COMMUNITY)

## 2015-08-06 ENCOUNTER — Encounter: Payer: Self-pay | Admitting: Neurology

## 2015-08-07 ENCOUNTER — Encounter: Payer: Self-pay | Admitting: Neurology

## 2015-08-09 ENCOUNTER — Encounter: Payer: Self-pay | Admitting: Pharmacist

## 2015-08-09 ENCOUNTER — Ambulatory Visit (INDEPENDENT_AMBULATORY_CARE_PROVIDER_SITE_OTHER): Admitting: Cardiovascular Disease

## 2015-08-09 DIAGNOSIS — I779 Disorder of arteries and arterioles, unspecified: Secondary | ICD-10-CM | POA: Diagnosis not present

## 2015-08-09 DIAGNOSIS — I10 Essential (primary) hypertension: Secondary | ICD-10-CM | POA: Diagnosis not present

## 2015-08-09 DIAGNOSIS — I2510 Atherosclerotic heart disease of native coronary artery without angina pectoris: Secondary | ICD-10-CM

## 2015-08-09 MED ORDER — LABETALOL HCL 200 MG PO TABS: 400.0000 mg | ORAL_TABLET | Freq: Two times a day (BID) | ORAL | Status: DC

## 2015-08-09 NOTE — Patient Instructions (Signed)
Increase Labetalol to 400 mg twice a day,.

## 2015-08-09 NOTE — Progress Notes (Signed)
Patient ID: Austin [place], [place], male   DOB: 10-16-1955, 59 y.o.   MRN: [id number]  Brief visit today for BP check. He was scheduled for an appt with [name] today at 1400h, although office was closed after 12 Noon. Reports BP remains elevated after switching to labetalol 200 mg BID (from carvedilol). Otherwise little change, except for possibly some increased hair loss.  BP checked by me today was 161/93 mm Hg.  Reviewed the plan in Dr. Antionette Char last note and increased the dose of labetalol to 400 mg BID.  Asked him to call back on 12/27 to reschedule his appointment.  [name], MD, [facility] [phone] office [phone] pager

## 2015-08-10 ENCOUNTER — Encounter: Payer: Self-pay | Admitting: Cardiovascular Disease

## 2015-08-13 ENCOUNTER — Encounter: Payer: Self-pay | Admitting: Pharmacist

## 2015-08-13 ENCOUNTER — Other Ambulatory Visit: Payer: Self-pay

## 2015-08-13 DIAGNOSIS — I10 Essential (primary) hypertension: Secondary | ICD-10-CM

## 2015-08-13 DIAGNOSIS — I779 Disorder of arteries and arterioles, unspecified: Secondary | ICD-10-CM

## 2015-08-13 DIAGNOSIS — I2510 Atherosclerotic heart disease of native coronary artery without angina pectoris: Secondary | ICD-10-CM

## 2015-08-13 DIAGNOSIS — I739 Peripheral vascular disease, unspecified: Principal | ICD-10-CM

## 2015-08-13 MED ORDER — LABETALOL HCL 200 MG PO TABS: 400.0000 mg | ORAL_TABLET | Freq: Two times a day (BID) | ORAL | Status: DC

## 2015-08-23 ENCOUNTER — Other Ambulatory Visit: Payer: Self-pay | Admitting: Cardiovascular Disease

## 2015-09-12 ENCOUNTER — Other Ambulatory Visit: Payer: Self-pay | Admitting: Cardiovascular Disease

## 2015-12-11 ENCOUNTER — Ambulatory Visit (INDEPENDENT_AMBULATORY_CARE_PROVIDER_SITE_OTHER): Admitting: Neurology

## 2015-12-11 ENCOUNTER — Telehealth: Payer: Self-pay

## 2015-12-11 ENCOUNTER — Encounter: Payer: Self-pay | Admitting: Neurology

## 2015-12-11 VITALS — BP 122/72 | HR 68 | Resp 20 | Ht 69.0 in | Wt 225.0 lb

## 2015-12-11 DIAGNOSIS — R0689 Other abnormalities of breathing: Secondary | ICD-10-CM

## 2015-12-11 DIAGNOSIS — I272 Other secondary pulmonary hypertension: Secondary | ICD-10-CM

## 2015-12-11 DIAGNOSIS — I420 Dilated cardiomyopathy: Secondary | ICD-10-CM | POA: Diagnosis not present

## 2015-12-11 DIAGNOSIS — G510 Bell's palsy: Secondary | ICD-10-CM | POA: Diagnosis not present

## 2015-12-11 DIAGNOSIS — G4733 Obstructive sleep apnea (adult) (pediatric): Secondary | ICD-10-CM

## 2015-12-11 DIAGNOSIS — Z9989 Dependence on other enabling machines and devices: Secondary | ICD-10-CM

## 2015-12-11 NOTE — Progress Notes (Signed)
SLEEP MEDICINE CLINIC   Provider:  Larey Seat, M D  Referring Provider: Marton Redwood, MD Primary Care Physician:  Marton Redwood, MD  Chief Complaint  Patient presents with  . Follow-up    switched DME to [facility], cpap going well, rm 11, alone    HPI:  Austin [name], [place] is a 60 y.o. male ,seen here as a referral  from Dr. 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 [name] for a evaluation of nocturnal hypoxemia. Austin [name] is a Caucasian left-handed male Ht 96 has recently also undergone an echocardiogram. The patient was recommended to undergo a polysomnography because he had a slight elevated pulmonary blood pressure. The nodes were recorded by Dr. Audrie Lia. He underwent an overnight pulse oximetry through his primary care office and this showed tachybradycardia arrhythmia the lowest pulse was in borderline region of 50 bpm but the highest pulse was 113 bpm at night. He also spent a total  time in hypoxemia , he was for 297 minutes at night at or below 89%  02 saturation and for 218 minutes at or below 88% saturation.  He was placed on oxygen after the study was obtained on 12-09-14 since then he had a repeat pulse oximetry on oxygen, he also had a Holter monitor both last last week and the results are not yet known to him. With that he had 2 previous sleep studies that did not show central or obstructive sleep apnea. 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 Austin [name] has been diagnosed with a mild pulmonary hypertension but he has thus far failed to ever test positive for sleep apnea.  He reports snoring he has lost  weight the last time I saw the patient was 7 years ago.  Interval history from 06-11-15, The patient underwent a split-night polysomnography on 04-02-15. Until this sleep study was obtained, the  patient had never been found to have sleep apnea- however this time his AHI was 34.4, his RDI which includes arousals from snoring, was 38.5. In supine sleep his apnea index increased to 60 per hour and in REM sleep to 41.3.  The oxygen nadir for the study was 74% and he states began 113 minutes in desaturation. Interestingly his heart rate remained very stable the average was 74 bpm in sinus rhythm. CPAP was initiated at 5 and step-by-step increased to 14 cm water. At this setting an AHI of 0.0 was reached. There were 10 central apneas induced during the CPAP titration. Oxygen desaturation was still evident with 83% nadir during the titration and 198 minutes of total time in desaturation. Periodic arousal index was 2.0. Heart rate again states stable this time at 68 beats per minutes on average. The study was interpreted as revealing by now a rather severe obstructive sleep apnea with REM accentuation and accentuation in supine sleep and associated with hypoxemia.  CPAP at 14 cm erased the AHI and the nadir of oxygen increased to 89% by the patient slept in supine position. Abdomen me rash quadruple fullface mask in medium size was used with heated humidity. We're following up today on this setting and the use of the machine for compliance.  Data obtained by my office on 06-05-15 revealed 100% of days of use and 97% compliance for over 4 hours of consecutive use at night average user  time is 7 hours 8 minutes. This CPAP is an AUTO setting, and revealed a 95% pressure of 11.1 cm water. The patient used O2 at 2 L with nasal cannula prior to the sleep study which confirmed the presence of OSA as well as hypoxemia. The patient has been become very familiar with his CPAP and actually now feels that he sleeps so much better that she craves the CPAP to some degree. He still bleeds 2 L of oxygen into the CPAP which and delivers it. He uses a full face mask by choice. I compared the data of his sleep study also was his  last overnight pulse oximetry which was performed as an ONO without CPAP  on 2 L of oxygen on 01-27-15.  Here the patient had a desaturation time at or below 90% SPO2 of only 1 minute and 12 seconds.  The patient reports that his sleep habits have changed under the CPAP therapy he often goes to bed earlier now he no longer sleeps in his recliner but actually goes to the bedroom and uses a CPAP. He feels that he sleeps longer and sounder. When he wakes up in the morning he is more refreshed and restored. He still reports nightmarish cream content sometimes waking up diaphoretic sometimes also with palpitations. This however has been less frequent since he is on CPAP and he feels that the sounder sleep is also reflected in a decrease in PTSD symptoms at night.  Today's Epworth sleepiness score was still endorsed at 13 points which is elevated his most sleepy time is after lunch. He endorsed to points for sitting and reading, watching TV or when he is a passenger in a car. Only 1 point for sitting inactive in a public place talking to someone sitting quietly after lunch or when driving and waiting at a stop light.   I see Austin [name] today on 12/11/2015 for compliance visit. He has trouble reading and watching TV feeling more fatigued but attributes this to the Bell's palsy which has made it hard for him to have posterior vision. 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 Dr. Brigitte Pulse examined him and they pulse oximetry 1 moving and determined that he needs oxygen at home 24 7 and he has now received a continuous oxygen concentrator as well is a portable oxygen tach. Adding this to his AutoPap has helped daytime fatigue.  He has been 100% compliance with daily over 4 hours consecutive use of CPAP. His AutoSet allows between 7 and 15 cm water pressure with an EPR of 2 cm water pressure. He does have mild to moderate air leaks, the 95th percentile pressure is 12.1 cm the residual AHI is 1.4. This is a very good resolution  of his apnea also he needs additional oxygen to be bled into the machine to have the most restorative sleep. He reports as long as the oxygen is available his sleep quality is restorative and refreshing. He now has a portable oxygen tank and a continuous oxygen concentrator.   The geriatric depression score was endorsed at 4 points which is clinically not indicative of depression, the Epworth sleepiness score was endorsed at 10 points. Fatigue severity is still endorsed at 44 points.    Review of Systems: Out of a complete 14 system review, the patient complains of only the following symptoms, and all other reviewed systems are negative.  snoring, weight gain, PTSD interrupting his sleep with nightmares. Bells palsy - right eye droop.  Pulmonary hypertension.   Epworth  score now  10 from 21 , Fatigue severity score  44 from 39  , depression score  4 .    Social History   Social History  . Marital Status: Married    Spouse Name: N/A  . Number of Children: N/A  . Years of Education: N/A   Occupational History  . PA     [employer]  . Retired    Social History Main Topics  . Smoking status: Former Smoker    Quit date: 01/21/1998  . Smokeless tobacco: Former Systems developer  . Alcohol Use: 0.6 oz/week    1 Standard drinks or equivalent per week  . Drug Use: No  . Sexual Activity: Not on file   Other Topics Concern  . Not on file   Social History Narrative   Disabled   Married   Former Scientist, clinical (histocompatibility and immunogenetics)   Regular exercise    Family History  Problem Relation Age of Onset  . Hypertension Other   . Hypertension Mother   . Emphysema Brother   . Stroke Brother     Past Medical History  Diagnosis Date  . CAD (coronary artery disease)     a. LHC 10/11:  LAD 40-50%; CFX 30%; OM2 30%; RCA 70-80% (tx with DES), 50-60% distal/ Promus DES to RCA 06/05/2010 [facility] trial)/ EF 60% on cath;  b.  Myoview 2/12: No ischemia, mild apical thinning, EF 55%;   c.  Echo 4/11: Mild  focal basal and mild concentric hypertrophy the septum, EF 17-35%, grade 2 diastolic dysfunction, mild LAE  . Pancreatitis   . Hyperlipidemia   . Hypertension   . 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 Obesity   . Carotid stenosis 02/2007    a.  0-39% bilateral; b. dopplers 05/2010: 67-01% RICA; 4-10% LICA;  c. dopplers 10/129:  40-59% bilat ICA (repeat 1 year)  . Bell's palsy 07/2009  . Insomnia   . PTSD (post-traumatic stress disorder)   . Dyspnea 05/2008    Myoview; TM/myoview 63% no ischemia/ scar  . Diabetes mellitus     SINCE 2004  . Seasonal allergies     Past Surgical History  Procedure Laterality Date  . Cardiac catheterization      LAD 40-50%; CFX 30%; OM2 30%; RCA 70-80% (tx with DES), 50-60% distal  . Lumbar disc surgery      C5-6  . Appendectomy    . Tonsillectomy    . Cervical spine surgery      2006  . Cholecystectomy  09/16/2012    Procedure: LAPAROSCOPIC CHOLECYSTECTOMY WITH INTRAOPERATIVE CHOLANGIOGRAM;  Surgeon: Odis Hollingshead, MD;  Location: [place];  Service: General;  Laterality: N/A;    Current Outpatient Prescriptions  Medication Sig Dispense Refill  . amLODipine-benazepril (LOTREL) 10-40 MG capsule TAKE 1 CAPSULE DAILY 90 capsule 3  . Ascorbic Acid (VITAMIN C) 1000 MG tablet Take 1,000 mg by mouth daily.    Marland Kitchen aspirin 81 MG tablet Take 81 mg by mouth daily.    Marland Kitchen atorvastatin (LIPITOR) 20 MG tablet TAKE 1 TABLET DAILY AT 6 P.M. 90 tablet 3  . Azilsartan Medoxomil (EDARBI) 80 MG TABS Take 1 tablet (80 mg total) by mouth daily. 30 tablet 1  . B Complex-C (SUPER B COMPLEX PO) Take 1 tablet by mouth daily.      . Canagliflozin (INVOKANA) 300 MG TABS Take 1 tablet by mouth daily with breakfast.    . Cholecalciferol (VITAMIN D3)  10000 UNITS TABS Take 5,000 Units by mouth daily.    . clonazePAM (KLONOPIN) 0.5 MG tablet Take 0.5 mg by mouth 2 (two) times daily.    . cloNIDine (CATAPRES) 0.2 MG tablet TAKE 1  TABLET 6 TIMES DAILY 540 tablet 5  . Coenzyme Q10 (COQ-10) 400 MG CAPS Take 400 mg by mouth daily.    . furosemide (LASIX) 40 MG tablet TAKE 1 TABLET DAILY 90 tablet 3  . glyBURIDE-metformin (GLUCOVANCE) 2.5-500 MG per tablet Take 2 tablets by mouth 2 (two) times daily.      . insulin aspart protamine- aspart (NOVOLOG MIX 70/30) (70-30) 100 UNIT/ML injection Inject 15-30 Units into the skin daily as needed (pt only uses for real sugar or carbs, at most once a month injection.).    Marland Kitchen labetalol (NORMODYNE) 200 MG tablet Take 2 tablets (400 mg total) by mouth 2 (two) times daily. 360 tablet 3  . levocetirizine (XYZAL) 5 MG tablet Take 5 mg by mouth every evening.    . milk thistle 175 MG tablet Take 175 mg by mouth daily.    . minoxidil (LONITEN) 10 MG tablet TAKE 2 TABLETS TWICE A DAY 360 tablet 2  . NITROSTAT 0.4 MG SL tablet DISSOLVE 1 TABLET UNDER THE TONGUE EVERY 5 MINUTES AS NEEDED FOR CHEST PAIN 25 tablet 1  . Omega-3 Fatty Acids (FISH OIL) 1200 MG CAPS Take 1 capsule by mouth daily.     . potassium chloride SA (K-DUR,KLOR-CON) 20 MEQ tablet Take 1 tablet (20 mEq total) by mouth 2 (two) times daily. 180 tablet 3  . SIMBRINZA 1-0.2 % SUSP     . Turmeric 500 MG CAPS Take 1 capsule by mouth daily.     No current facility-administered medications for this visit.    Allergies as of 12/11/2015 - Review Complete 12/11/2015  Allergen Reaction Noted  . Morphine Other (See Comments)   . Nifedipine Other (See Comments)   . Darvon [propoxyphene] Itching 09/01/2012  . Tramadol Other (See Comments) 01/15/2012  . Amoxicillin Hives and Rash   . Amoxicillin-pot clavulanate Hives and Rash     Vitals: BP 122/72 mmHg  Pulse 68  Resp 20  Ht _0  (1.753 m)  Wt 225 lb (102.059 kg)  BMI 33.21 kg/m2 Last Weight:  Wt Readings from Last 1 Encounters:  12/11/15 225 lb (102.059 kg)       Last Height:   Ht Readings from Last 1 Encounters:  12/11/15 _1  (1.753 m)    Physical exam:  General: The  patient is awake, alert and appears not in acute distress. The patient is well groomed. Head: Normocephalic, atraumatic. Neck is supple. Mallampati 3 ,  the left now the on is more narrowed than the right but appears patent, the mucous lining is reddened and swollen.  neck circumference:22. Nasal airflow as above  TMJ is not  evident . Retrognathia is not seen.  Cardiovascular:  Regular rate and rhythm , without  murmurs or carotid bruit, and without distended neck veins. Respiratory: Lungs are clear to auscultation. Skin:  Without evidence of edema, or rash Trunk: BMI is  elevated and patient has normal posture. Anterior cervical fusion patient , low ROM in the neck and back pain.   Neurologic exam : The patient is awake and alert, oriented to place and time.   Memory subjective described as intact. There is a normal attention span & concentration ability.  Speech is fluent without  dysarthria, dysphonia or aphasia. Mood and affect  are appropriate.  Cranial nerves: Pupils are equal and briskly reactive to light. Funduscopic exam without evidence of pallor or edema.  Right eye droopy, Bells palsy.  Extraocular movements  in vertical and horizontal planes intact and without nystagmus. Visual fields by finger perimetry are intact. Hearing to finger rub intact.  Facial sensation intact to fine touch. Facial motor strength is symmetric and tongue and uvula move midline. Motor exam:  Normal tone, muscle bulk and symmetric ,strength in all extremities. Sensory:  Fine touch, pinprick and vibration were tested in all extremities. Proprioception is normal. Coordination: Rapid alternating movements in the fingers/hands is normal. Finger-to-nose maneuver normal without evidence of ataxia, dysmetria or tremor. Gait and station: Patient walks without assistive device and is able unassisted to climb up to the exam table. Strength within normal limits. Stance is stable and normal.  Deep tendon reflexes: in the   upper and lower extremities are symmetric and intact. Babinski maneuver response is downgoing.  Assessment:  After physical and neurologic examination, review of laboratory studies, imaging, neurophysiology testing and pre-existing records, assessment is :   1) OSA, severe with positional and REM accentuation.   2)  Hypoxemia - appears resolved on O2 and now on CPAP with O2 . Dr Brigitte Pulse added oxygen 24/7 after a pulse oximetry-  This in return will now facilitate his retirement.  Pulmonary hypertension.   3)  PTSD, reflected in the nightmares and diaphoretic arousals. Flash backs in daytime and at night.   4 ) Bell's palsy- vision impairment.    The patient was advised of the nature of the diagnosed sleep disorder , the treatment options and risks for general a health and wellness arising from not treating the condition. Visit duration was 30 minutes. More than 50% of the face to face time was dedicated to the coordination of care with internist, pulmonologist ( Dr. Lake Bells ) , and [place] .   Plan:  Treatment plan and additional workup :  I see Mr. Hemmelgarn today on 12/11/2015 for compliance visit. He has been 100% compliance with daily over 4 hours consecutive use of CPAP. His AutoSet allows between 7 and 15 cm water pressure with an EPR of 2 cm water pressure. He does have mild to moderate air leaks, the 95th percentile pressure is 12.1 cm the residual AHI is 1.4. This is a very good resolution of his apnea also he needs additional oxygen to be bled into the machine to have the most restorative sleep. He reports as long as the oxygen is available his sleep quality is restorative and refreshing.    DME changed form [facility] to [facility].   Rv in 12 month.    Austin Partridge Geovany Trudo MD  12/11/2015  Cc  Carmie Kanner, MD

## 2015-12-11 NOTE — Telephone Encounter (Signed)
I spoke to the Respiratory Therapist at [facility]. She has not been able to add pt to airview yet, but will add him tonight. Serial number and device number listed below. NO:[id number] GG: [id number]

## 2015-12-11 NOTE — Telephone Encounter (Signed)
Pt has switched DME providers for his cpap and O2 concentrator to [facility]. I called [facility] because pt's updated therapy report is not in airview and the new cpap provided by [facility] is wireless without a memory chip. Phones were busy, my call was directed to a central location and a message was taken for [facility] to call me back.

## 2016-02-03 IMAGING — MR MR LUMBAR SPINE W/O CM
7 of 15 series · 22 of 48 positions shown · non-contrast
Comparison: Cervical MRI 12/22/2005

CLINICAL DATA: Cervicalgia.  Thoracic and back pain

EXAM:
MRI CERVICAL AND THORACIC AND LUMBAR SPINE WITHOUT CONTRAST
TECHNIQUE: Multiplanar and multiecho pulse sequences of the cervical spine, to
include the craniocervical junction and cervicothoracic junction,
and lumbar spine, were obtained without intravenous contrast.

[Series 3: T2 · sagittal · 3.3mm · 0.43mm/px · 2 of 12 slices shown (1 of 6)]
[im 1/12]
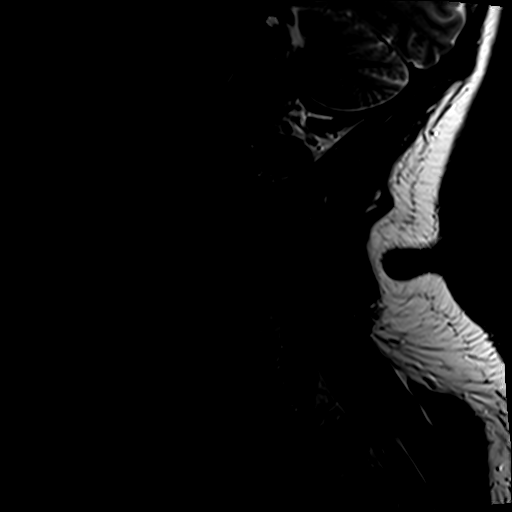
[im 12/12]
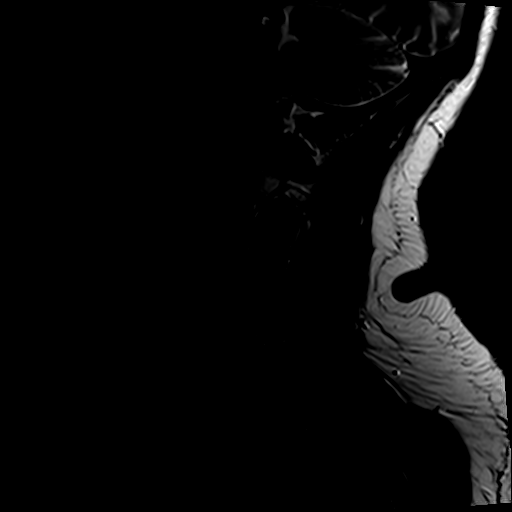

[Series 4: T1 · sagittal · 3.3mm · 0.43mm/px · 1 of 12 slices shown]
[im 1/12]
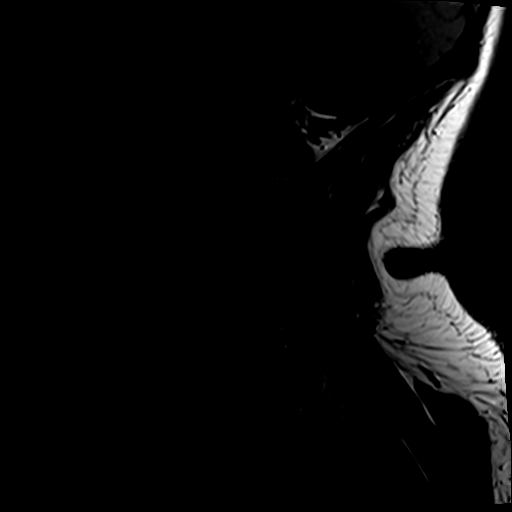

[Series 7: T2 · axial · 3.0mm · 0.74mm/px · z∈[-120,-21]mm · 4 of 24 slices shown (2 of 6)]
[im 1/24]
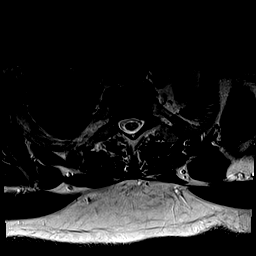
[im 8/24]
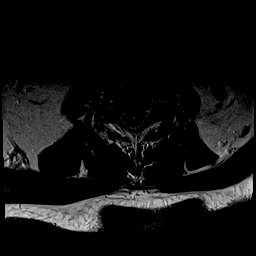
[im 16/24]
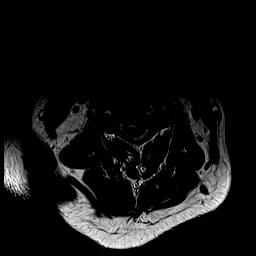
[im 24/24]
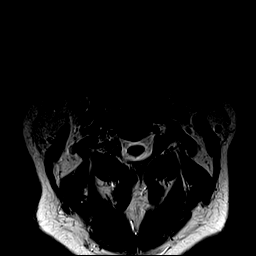

[Series 11: T2 · sagittal · 4.0mm · 0.53mm/px · 2 of 12 slices shown (3 of 6)]
[im 1/12]
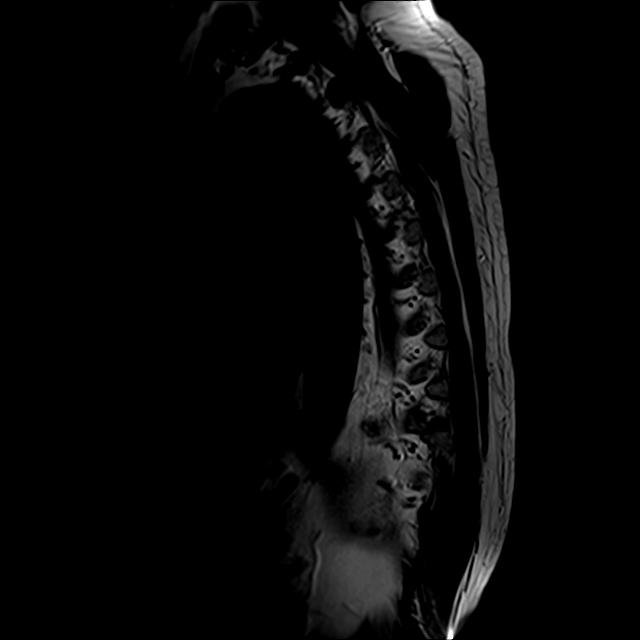
[im 12/12]
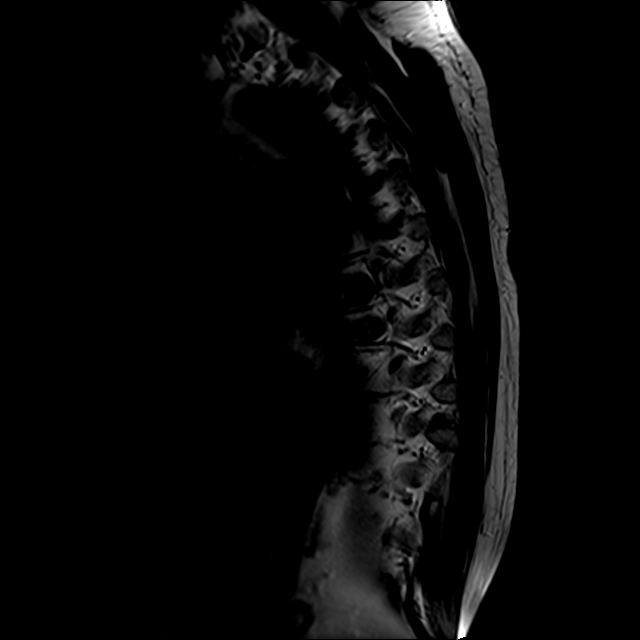

[Series 15: T2 · axial · 4.0mm · 0.37mm/px · z∈[-336,-88]mm · 6 of 39 slices shown (4 of 6)]
[im 1/39]
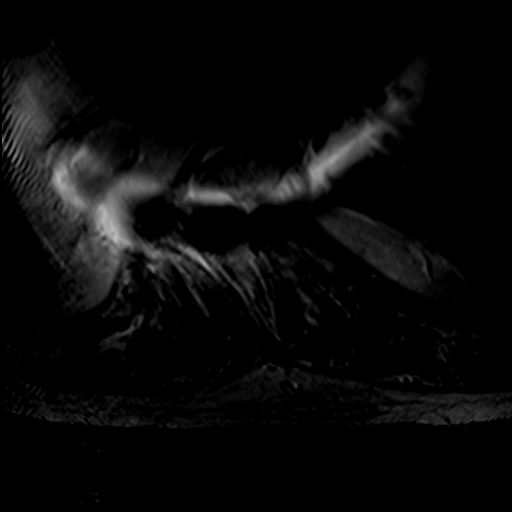
[im 8/39]
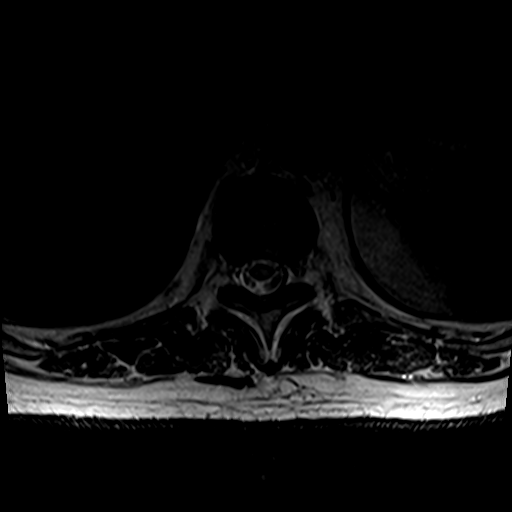
[im 16/39]
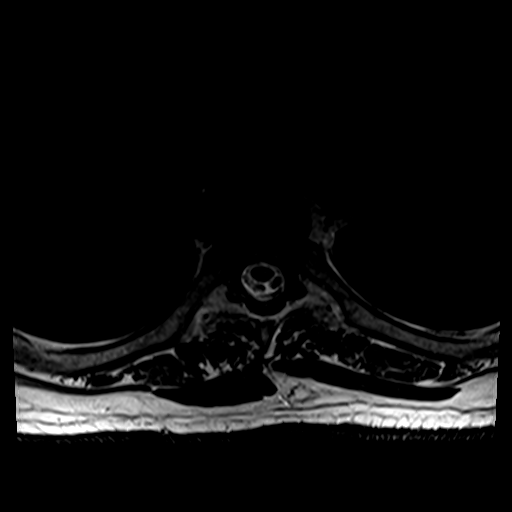
[im 23/39]
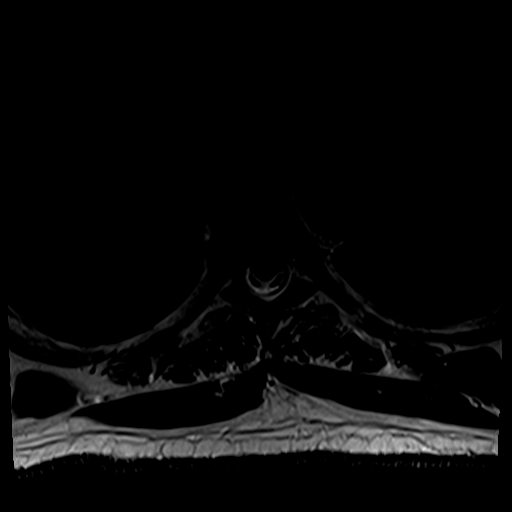
[im 31/39]
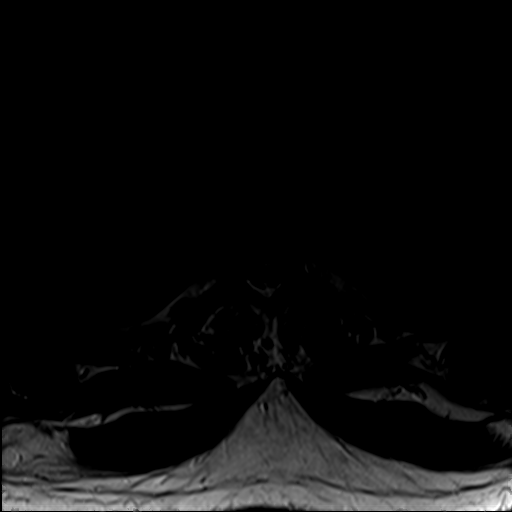
[im 39/39]
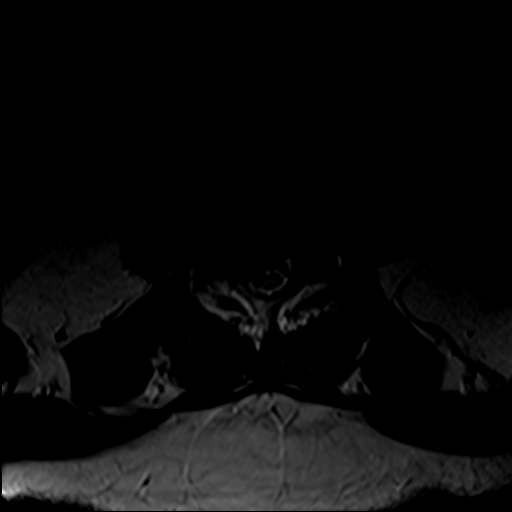

[Series 17: T2 · sagittal · 4.0mm · 0.44mm/px · 2 of 12 slices shown (5 of 6)]
[im 1/12]
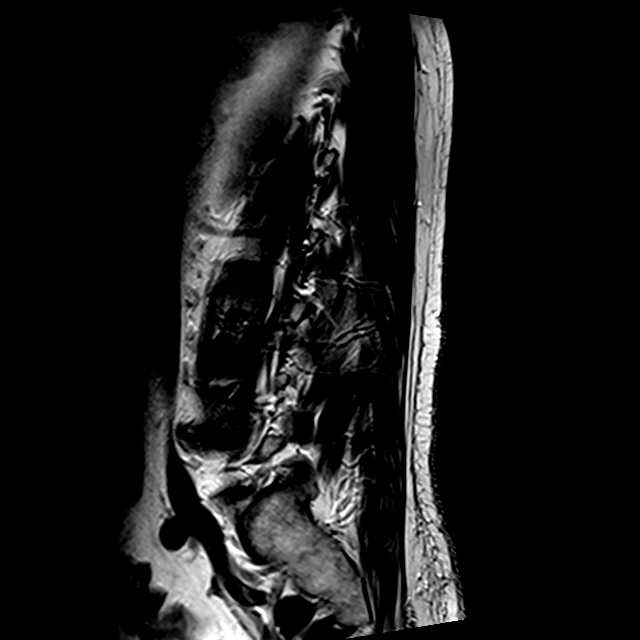
[im 12/12]
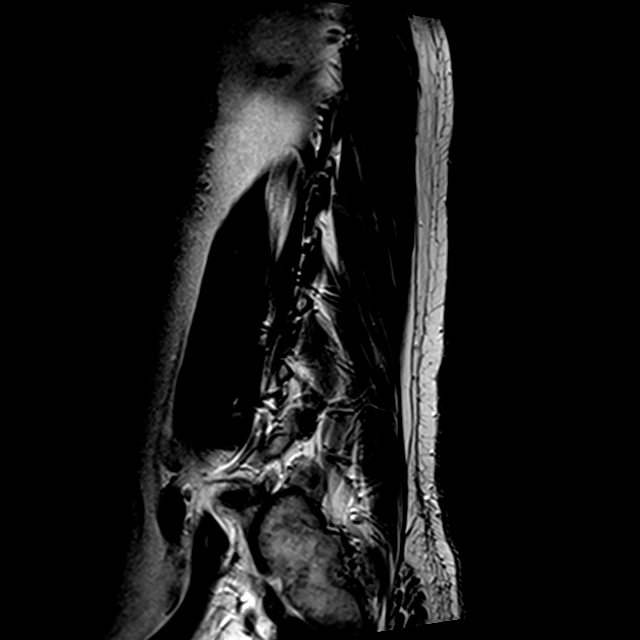

[Series 21: T2 · axial · 4.0mm · 0.74mm/px · z∈[-533,-296]mm · 5 of 33 slices shown (6 of 6)]
[im 1/33]
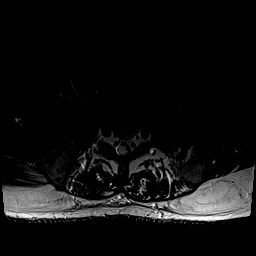
[im 9/33]
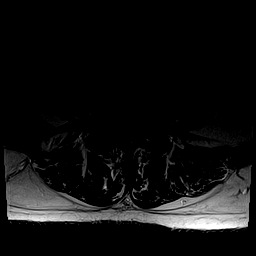
[im 17/33]
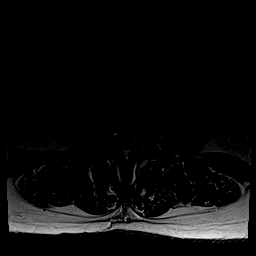
[im 25/33]
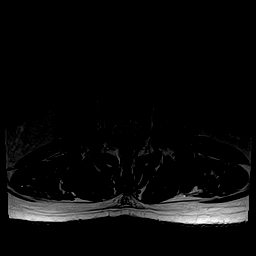
[im 33/33]
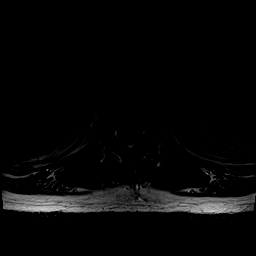

[22 of 48 positions shown; findings below may reference images not displayed]

FINDINGS: MRI CERVICAL SPINE FINDINGS

Normal cervical alignment. Negative for fracture or mass. Spinal
cord signal normal. Cervical medullary junction normal.

C2-3:  Negative

C3-4: Disc degeneration and spondylosis is similar to the prior
study. Diffuse uncinate spurring causing mild spinal stenosis

C4-5: Mild disc degeneration and mild uncinate spurring causing mild
spinal stenosis. Left-sided facet hypertrophy.

C5-6: Left paracentral disc and osteophyte is unchanged with slight
flattening of the ventral surface of the cord. Minimal spinal
stenosis

C6-7: Interbody fusion with bone graft as noted on radiograph of
09/01/2006. No significant spinal stenosis

C7-T1: Negative

MRI THORACIC SPINE FINDINGS

Normal thoracic alignment. Negative for fracture or mass. Congenital
fusion T10 and T11 vertebral bodies. Spinal cord signal is normal.
No cord compression or cord lesion.

Mild thoracic disc degeneration. Abnormal disc spaces are described
below.

T3-4: Mild disc degeneration and spurring without significant spinal
stenosis

Small left-sided osteophyte T7-8 without stenosis.

MRI LUMBAR SPINE FINDINGS

Normal lumbar alignment. Negative for fracture or mass lesion. No
bone marrow edema. Conus medullaris normal and terminates at L1-2.

L1-2:  Negative

L2-3: Mild disc bulging and spurring. Early facet degeneration with
mild spinal stenosis.

L3-4:  Mild disc degeneration and spurring.  Mild spinal stenosis

L4-5: Large chronic disc protrusion centrally with large associated
osteophyte. This indents the thecal sac and causes severe spinal
stenosis. Bilateral facet hypertrophy also noted. Subarticular
stenosis with impingement of the L5 nerve root bilaterally.

L5-S1: Disc degeneration with prominent central osteophyte causing
mild spinal stenosis. This is touching the right S1 nerve root
without nerve root compression.
IMPRESSION: Cervical spine:

Mild spinal stenosis C3-4 and C4-5 unchanged from the prior study.
Left paracentral disc and osteophyte at C5-6 also unchanged.
Interbody fusion C6-7.

Thoracic spine: Mild thoracic degenerative change without
significant stenosis.

Lumbar spine:

Mild spinal stenosis L2-3 and L3-4

Severe spinal stenosis L4-5 with a large chronic central disc
protrusion with associated large osteophyte.

Mild spinal stenosis due to spurring L5-S1.

## 2016-02-07 ENCOUNTER — Ambulatory Visit (INDEPENDENT_AMBULATORY_CARE_PROVIDER_SITE_OTHER): Admitting: Cardiovascular Disease

## 2016-02-07 ENCOUNTER — Encounter: Payer: Self-pay | Admitting: Cardiovascular Disease

## 2016-02-07 VITALS — BP 160/80 | HR 80 | Ht 68.5 in | Wt 219.1 lb

## 2016-02-07 DIAGNOSIS — I6523 Occlusion and stenosis of bilateral carotid arteries: Secondary | ICD-10-CM

## 2016-02-07 DIAGNOSIS — I10 Essential (primary) hypertension: Secondary | ICD-10-CM

## 2016-02-07 NOTE — Patient Instructions (Signed)
Medication Instructions:  Your physician recommends that you continue on your current medications as directed. Please refer to the Current Medication list given to you today.  Labwork: No new orders.   Testing/Procedures: Your physician has requested that you have a carotid duplex. This test is an ultrasound of the carotid arteries in your neck. It looks at blood flow through these arteries that supply the brain with blood. Allow one hour for this exam. There are no restrictions or special instructions.  Follow-Up: Your physician wants you to follow-up in: 6 MONTHS with Dr Burt Knack.  You will receive a reminder letter in the mail two months in advance. If you don't receive a letter, please call our office to schedule the follow-up appointment.   Any Other Special Instructions Will Be Listed Below (If Applicable).     If you need a refill on your cardiac medications before your next appointment, please call your pharmacy.

## 2016-02-07 NOTE — Progress Notes (Signed)
Cardiology Office Note Date:  02/07/2016   ID:  Austin Florida, PA, DOB 1955/11/26, MRN [id number]  PCP:  Marton Redwood, MD  Cardiologist:  Sherren Mocha, MD    Chief Complaint  Patient presents with  . Cardiomyopathy    Dilated. Has a "burning" sensation now and again usually with activity. no sob,lee,or claudication   History of Present Illness: Austin THEDE, PA is a 60 y.o. male who presents for follow-up of coronary artery disease and malignant hypertension. The patient has coronary artery disease and underwent stenting of the right coronary artery in 2011 after presenting with atypical angina. He has severe hypertension and is maintained on multiple antihypertensive medications.   The patient has turned and is noticing work. He plans on retiring in September. He's having more difficulty working because of chronic Bell's palsy and difficulty with his vision as he does fairly intricate procedures. He also has more difficulty managing his blood pressure because he doesn't like to use clonidine when he has to work. Feels like his blood pressure control has been reasonable. Clonidine use is highly variable, sometimes with little use and other times up to 6 times daily. He has no chest pain or pressure. He is able to do some physical work with only mild dyspnea. He has been documented to have hypoxemia. He has been evaluated by Dr. Annamaria Boots and Dr. Lake Bells in the past.    Past Medical History  Diagnosis Date  . CAD (coronary artery disease)     a. LHC 10/11:  LAD 40-50%; CFX 30%; OM2 30%; RCA 70-80% (tx with DES), 50-60% distal/ Promus DES to RCA 06/05/2010 [facility] trial)/ EF 60% on cath;  b.  Myoview 2/12: No ischemia, mild apical thinning, EF 55%;   c.  Echo 4/11: Mild focal basal and mild concentric hypertrophy the septum, EF 38-93%, grade 2 diastolic dysfunction, mild LAE  . Pancreatitis   . Hyperlipidemia   . Hypertension   . Sleep-related hypoventilation     Former  Scientist, clinical (histocompatibility and immunogenetics); noctural desaturaion; no apnea  . Obesity   . Carotid stenosis 02/2007    a.  0-39% bilateral; b. dopplers 05/2010: 73-42% RICA; 8-76% LICA;  c. dopplers 03/1156:  40-59% bilat ICA (repeat 1 year)  . Bell's palsy 07/2009  . Insomnia   . PTSD (post-traumatic stress disorder)   . Dyspnea 05/2008    Myoview; TM/myoview 63% no ischemia/ scar  . Diabetes mellitus     SINCE 2004  . Seasonal allergies     Past Surgical History  Procedure Laterality Date  . Cardiac catheterization      LAD 40-50%; CFX 30%; OM2 30%; RCA 70-80% (tx with DES), 50-60% distal  . Lumbar disc surgery      C5-6  . Appendectomy    . Tonsillectomy    . Cervical spine surgery      2006  . Cholecystectomy  09/16/2012    Procedure: LAPAROSCOPIC CHOLECYSTECTOMY WITH INTRAOPERATIVE CHOLANGIOGRAM;  Surgeon: Odis Hollingshead, MD;  Location: [facility];  Service: General;  Laterality: N/A;    Current Outpatient Prescriptions  Medication Sig Dispense Refill  . amLODipine-benazepril (LOTREL) 10-40 MG capsule Take 1 capsule by mouth daily.    . Ascorbic Acid (VITAMIN C) 1000 MG tablet Take 1,000 mg by mouth daily.    Marland Kitchen aspirin 81 MG tablet Take 81 mg by mouth daily.    Marland Kitchen atorvastatin (LIPITOR) 20 MG tablet Take 20 mg by mouth daily at 6 PM.    .  Azilsartan Medoxomil (EDARBI) 80 MG TABS Take 1 tablet (80 mg total) by mouth daily. 30 tablet 1  . B Complex-C (SUPER B COMPLEX PO) Take 1 tablet by mouth daily.      . Canagliflozin (INVOKANA) 300 MG TABS Take 1 tablet by mouth daily with breakfast.    . Cholecalciferol (VITAMIN D3) 10000 UNITS TABS Take 5,000 Units by mouth daily.    . clonazePAM (KLONOPIN) 0.5 MG tablet Take 0.5 mg by mouth 2 (two) times daily.    . cloNIDine (CATAPRES) 0.2 MG tablet Take 0.2 mg by mouth 6 (six) times daily.    . Coenzyme Q10 (COQ-10) 400 MG CAPS Take 400 mg by mouth daily.    . furosemide (LASIX) 40 MG tablet Take 40 mg by mouth daily.    Marland Kitchen glyBURIDE-metformin (GLUCOVANCE) 2.5-500 MG  per tablet Take 2 tablets by mouth 2 (two) times daily.      . insulin aspart protamine- aspart (NOVOLOG MIX 70/30) (70-30) 100 UNIT/ML injection Inject 15-30 Units into the skin daily as needed (pt only uses for real sugar or carbs, at most once a month injection.).    Marland Kitchen labetalol (NORMODYNE) 200 MG tablet Take 2 tablets (400 mg total) by mouth 2 (two) times daily. 360 tablet 3  . levocetirizine (XYZAL) 5 MG tablet Take 5 mg by mouth every evening.    . milk thistle 175 MG tablet Take 175 mg by mouth daily.    . minoxidil (LONITEN) 10 MG tablet Take 20 mg by mouth 2 (two) times daily.    Marland Kitchen NITROSTAT 0.4 MG SL tablet DISSOLVE 1 TABLET UNDER THE TONGUE EVERY 5 MINUTES AS NEEDED FOR CHEST PAIN 25 tablet 1  . Omega-3 Fatty Acids (FISH OIL) 1200 MG CAPS Take 1 capsule by mouth daily.     . potassium chloride SA (K-DUR,KLOR-CON) 20 MEQ tablet Take 1 tablet (20 mEq total) by mouth 2 (two) times daily. 180 tablet 3  . SIMBRINZA 1-0.2 % SUSP     . Turmeric 500 MG CAPS Take 1 capsule by mouth daily.    Marland Kitchen VITAMIN D, ERGOCALCIFEROL, PO Take 5,000 Units by mouth daily.     No current facility-administered medications for this visit.    Allergies:   Morphine; Nifedipine; Darvon; Tramadol; Amoxicillin; and Amoxicillin-pot clavulanate   Social History:  The patient  reports that he quit smoking about 18 years ago. He has quit using smokeless tobacco. He reports that he drinks about 0.6 oz of alcohol per week. He reports that he does not use illicit drugs.   Family History:  The patient's  family history includes Emphysema in his brother; Hypertension in his mother and other; Stroke in his brother.    ROS:  Please see the history of present illness.  Otherwise, review of systems is positive for fatigue, sleep apnea.  All other systems are reviewed and negative.    PHYSICAL EXAM: VS:  BP 160/80 mmHg  Pulse 80  Ht 5' 8.5" (1.74 m)  Wt 219 lb 1.9 oz (99.392 kg)  BMI 32.83 kg/m2 , BMI Body mass index is  32.83 kg/(m^2). GEN: Well nourished, well developed, overweight male in no acute distress HEENT: normal Neck: no JVD, no masses. left carotid bruit Cardiac: RRR with 2/6 SEM at the RUSB              Respiratory:  clear to auscultation bilaterally, normal work of breathing GI: soft, nontender, nondistended, + BS MS: no deformity or atrophy Ext: no pretibial  edema, pedal pulses 2+= bilaterally Skin: warm and dry, no rash Neuro:  Strength and sensation are intact Psych: euthymic mood, full affect  EKG:  EKG is ordered today. The ekg ordered today shows normal sinus rhythm 76 bpm, nonspecific IVCD, otherwise within normal limits.  Recent Labs: 03/15/2015: BUN 9; Creatinine, Ser 0.75; Potassium 4.5; Sodium 142   Lipid Panel     Component Value Date/Time   CHOL 111 09/19/2007 0850   TRIG 102 09/19/2007 0850   HDL 29.3* 09/19/2007 0850   CHOLHDL 3.8 CALC 09/19/2007 0850   VLDL 20 09/19/2007 0850   LDLCALC 61 09/19/2007 0850      Wt Readings from Last 3 Encounters:  02/07/16 219 lb 1.9 oz (99.392 kg)  12/11/15 225 lb (102.059 kg)  06/11/15 220 lb (99.791 kg)     Cardiac Studies Reviewed: 2D Echo 02-01-2015: Study Conclusions  - Left ventricle: The cavity size was normal. There was mild  concentric hypertrophy. Systolic function was normal. The  estimated ejection fraction was in the range of 55% to 60%. Wall  motion was normal; there were no regional wall motion  abnormalities. - Left atrium: The atrium was mildly dilated. - Atrial septum: No defect or patent foramen ovale was identified. - Pulmonary arteries: PA peak pressure: 35 mm Hg (S). - Pericardium, extracardiac: A trivial pericardial effusion was  identified.  Renal arterial duplex 04/19/2015: Widely patent renal arteries without stenosis  ASSESSMENT AND PLAN: 1.  Malignant hypertension: Patient will continue current medical regimen. I think retirement should help in his blood pressure management. He  understands the need for sodium restriction. He needs to be able to exercise and lose weight and hopefully he can focus on this when he retires. He also should be elevated take his clonidine more regularly which will help in his overall control. He is on a complex multidrug therapy.  2. Carotid stenosis without history of stroke or TIA: Due for repeat carotid duplex scan. His last study from 2015 was reviewed. Will order carotid duplex.   3. Coronary artery disease, native vessel: No symptoms of angina. Continue current medicines.  4. Hyperlipidemia: Treated with atorvastatin 20 mg daily. Labs are followed by Dr. Brigitte Pulse.  Current medicines are reviewed with the patient today.  The patient does not have concerns regarding medicines.  Labs/ tests ordered today include:  No orders of the defined types were placed in this encounter.   Disposition:   FU 6 months  Signed, Sherren Mocha, MD  02/07/2016 3:19 PM    [facility] [place], [place],   [address] Phone: [phone]; Fax: [phone]

## 2016-02-19 ENCOUNTER — Other Ambulatory Visit: Payer: Self-pay | Admitting: Cardiovascular Disease

## 2016-02-21 ENCOUNTER — Ambulatory Visit (HOSPITAL_COMMUNITY)
Admission: RE | Admit: 2016-02-21 | Discharge: 2016-02-21 | Disposition: A | Discharge status: Home / Self Care | Source: Ambulatory Visit | Attending: Cardiovascular Disease | Admitting: Cardiovascular Disease

## 2016-02-21 DIAGNOSIS — I6523 Occlusion and stenosis of bilateral carotid arteries: Secondary | ICD-10-CM | POA: Diagnosis not present

## 2016-02-21 DIAGNOSIS — I10 Essential (primary) hypertension: Secondary | ICD-10-CM | POA: Insufficient documentation

## 2016-02-21 DIAGNOSIS — E785 Hyperlipidemia, unspecified: Secondary | ICD-10-CM | POA: Insufficient documentation

## 2016-02-21 DIAGNOSIS — I2510 Atherosclerotic heart disease of native coronary artery without angina pectoris: Secondary | ICD-10-CM | POA: Diagnosis not present

## 2016-02-21 DIAGNOSIS — E119 Type 2 diabetes mellitus without complications: Secondary | ICD-10-CM | POA: Diagnosis not present

## 2016-02-21 DIAGNOSIS — Z87891 Personal history of nicotine dependence: Secondary | ICD-10-CM | POA: Diagnosis not present

## 2016-02-25 ENCOUNTER — Encounter: Payer: Self-pay | Admitting: Cardiovascular Disease

## 2016-02-25 ENCOUNTER — Encounter: Payer: Self-pay | Admitting: Internal Medicine

## 2016-02-28 ENCOUNTER — Encounter: Payer: Self-pay | Admitting: Cardiovascular Disease

## 2016-03-20 ENCOUNTER — Other Ambulatory Visit: Payer: Self-pay | Admitting: Cardiovascular Disease

## 2016-03-20 NOTE — Telephone Encounter (Signed)
Rx sent.

## 2016-04-30 ENCOUNTER — Other Ambulatory Visit: Payer: Self-pay | Admitting: Internal Medicine

## 2016-04-30 DIAGNOSIS — R748 Abnormal levels of other serum enzymes: Secondary | ICD-10-CM

## 2016-05-07 ENCOUNTER — Other Ambulatory Visit

## 2016-05-13 ENCOUNTER — Ambulatory Visit
Admission: RE | Admit: 2016-05-13 | Discharge: 2016-05-13 | Disposition: A | Discharge status: Home / Self Care | Source: Ambulatory Visit | Attending: Internal Medicine | Admitting: Internal Medicine

## 2016-05-13 DIAGNOSIS — R748 Abnormal levels of other serum enzymes: Secondary | ICD-10-CM

## 2016-05-28 ENCOUNTER — Encounter: Payer: Self-pay | Admitting: Cardiovascular Disease

## 2016-06-01 ENCOUNTER — Other Ambulatory Visit: Payer: Self-pay

## 2016-06-01 MED ORDER — CARVEDILOL PHOSPHATE ER 40 MG PO CP24: 40.0000 mg | ORAL_CAPSULE | Freq: Every day | ORAL | 3 refills | Status: DC

## 2016-06-21 ENCOUNTER — Other Ambulatory Visit: Payer: Self-pay | Admitting: Cardiovascular Disease

## 2016-07-22 ENCOUNTER — Other Ambulatory Visit: Payer: Self-pay | Admitting: Cardiovascular Disease

## 2016-08-11 ENCOUNTER — Ambulatory Visit (INDEPENDENT_AMBULATORY_CARE_PROVIDER_SITE_OTHER): Admitting: Cardiovascular Disease

## 2016-08-11 ENCOUNTER — Encounter: Payer: Self-pay | Admitting: Cardiovascular Disease

## 2016-08-11 VITALS — BP 160/80 | HR 82 | Ht 69.0 in | Wt 211.1 lb

## 2016-08-11 DIAGNOSIS — I10 Essential (primary) hypertension: Secondary | ICD-10-CM

## 2016-08-11 NOTE — Patient Instructions (Signed)
Medication Instructions:  Your physician recommends that you continue on your current medications as directed. Please refer to the Current Medication list given to you today.  Labwork: No new orders.   Testing/Procedures: No new orders.   Follow-Up: Your physician recommends that you schedule a follow-up appointment in: March with Dr Burt Knack   Any Other Special Instructions Will Be Listed Below (If Applicable).     If you need a refill on your cardiac medications before your next appointment, please call your pharmacy.

## 2016-08-11 NOTE — Progress Notes (Signed)
Cardiology Office Note Date:  08/11/2016   ID:  Austin [name], DOB September 25, 1955, MRN [id number]  PCP:  Marton Redwood, MD  Cardiologist:  Sherren Mocha, MD    Chief Complaint  Patient presents with  . Cardiomyopathy     History of Present Illness: Austin [name] is a 60 y.o. male who presents for follow-up of CAD and malignant hypertension. The patient has coronary artery disease and underwent stenting of the right coronary artery in 2011 after presenting with atypical angina. He has severe hypertension and is maintained on multiple antihypertensive medications, difficult to control.   [name] is here alone today. He is in the process of getting his house ready to sell. He plans to move to [place], [place]. He quit work earlier this year because of problems with his health. He's developed problems with vision involving his right eye. He also has had a lot of difficulty with tolerance of his complex antihypertensive medicine regimen. He has to take clonidine frequently throughout the day and has difficulty because of somnolence.  He denies chest pain or pressure. No shortness of breath. No change in mild chronic leg swelling. No lightheadedness or syncope.   Past Medical History:  Diagnosis Date  . Bell's palsy 07/2009  . CAD (coronary artery disease)    a. LHC 10/11:  LAD 40-50%; CFX 30%; OM2 30%; RCA 70-80% (tx with DES), 50-60% distal/ Promus DES to RCA 06/05/2010 [facility] trial)/ EF 60% on cath;  b.  Myoview 2/12: No ischemia, mild apical thinning, EF 55%;   c.  Echo 4/11: Mild focal basal and mild concentric hypertrophy the septum, EF 12-87%, grade 2 diastolic dysfunction, mild LAE  . Carotid stenosis 02/2007   a.  0-39% bilateral; b. dopplers 05/2010: 86-76% RICA; 7-20% LICA;  c. dopplers 04/4708:  40-59% bilat ICA (repeat 1 year)  . Diabetes mellitus    SINCE 2004  . Dyspnea 05/2008   Myoview; TM/myoview 63% no ischemia/ scar  . Hyperlipidemia   .  Hypertension   . Insomnia   . Obesity   . Pancreatitis   . PTSD (post-traumatic stress disorder)   . Seasonal allergies   . 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 APPENDECTOMY    . CARDIAC CATHETERIZATION     LAD 40-50%; CFX 30%; OM2 30%; RCA 70-80% (tx with DES), 50-60% distal  . CERVICAL SPINE SURGERY     2006  . CHOLECYSTECTOMY  09/16/2012   Procedure: LAPAROSCOPIC CHOLECYSTECTOMY WITH INTRAOPERATIVE CHOLANGIOGRAM;  Surgeon: Odis Hollingshead, MD;  Location: [facility];  Service: General;  Laterality: N/A;  . LUMBAR DISC SURGERY     C5-6  . TONSILLECTOMY      Current Outpatient Prescriptions  Medication Sig Dispense Refill  . amLODipine-benazepril (LOTREL) 10-40 MG capsule Take 1 capsule by mouth daily.    . Ascorbic Acid (VITAMIN C) 1000 MG tablet Take 1,000 mg by mouth daily.    Marland Kitchen aspirin 81 MG tablet Take 81 mg by mouth daily.    Marland Kitchen atorvastatin (LIPITOR) 20 MG tablet Take 20 mg by mouth daily.    . Azilsartan Medoxomil (EDARBI) 80 MG TABS Take 80 mg by mouth daily.    . B Complex-C (SUPER B COMPLEX PO) Take 1 tablet by mouth daily.      . Canagliflozin (INVOKANA) 300 MG TABS Take 1 tablet by mouth daily with breakfast.    .  carvedilol (COREG CR) 40 MG 24 hr capsule Take 1 capsule (40 mg total) by mouth daily. 90 capsule 3  . clonazePAM (KLONOPIN) 0.5 MG tablet Take 0.5 mg by mouth 2 (two) times daily.    . cloNIDine (CATAPRES) 0.2 MG tablet Take 0.2 mg by mouth 6 (six) times daily.    . Coenzyme Q10 (COQ-10) 400 MG CAPS Take 400 mg by mouth daily.    . furosemide (LASIX) 40 MG tablet Take 40 mg by mouth daily.    Marland Kitchen glyBURIDE-metformin (GLUCOVANCE) 2.5-500 MG per tablet Take 2 tablets by mouth 2 (two) times daily.      . insulin aspart protamine- aspart (NOVOLOG MIX 70/30) (70-30) 100 UNIT/ML injection Inject 15-30 Units into the skin daily as needed (pt only uses for real sugar  or carbs, at most once a month injection.).    Marland Kitchen levocetirizine (XYZAL) 5 MG tablet Take 5 mg by mouth every evening.    . milk thistle 175 MG tablet Take 175 mg by mouth daily.    . minoxidil (LONITEN) 10 MG tablet TAKE 2 TABLETS TWICE A DAY 360 tablet 2  . minoxidil (LONITEN) 10 MG tablet Take 20 mg by mouth 2 (two) times daily.    Marland Kitchen NITROSTAT 0.4 MG SL tablet DISSOLVE 1 TABLET UNDER THE TONGUE EVERY 5 MINUTES AS NEEDED FOR CHEST PAIN 25 tablet 1  . Omega-3 Fatty Acids (FISH OIL) 1200 MG CAPS Take 1 capsule by mouth daily.     . potassium chloride SA (K-DUR,KLOR-CON) 20 MEQ tablet Take 20 mEq by mouth 2 (two) times daily.    Marland Kitchen SIMBRINZA 1-0.2 % SUSP Place 1 drop into both eyes 3 (three) times daily.     . Turmeric 500 MG CAPS Take 1 capsule by mouth daily.    Marland Kitchen VITAMIN D, ERGOCALCIFEROL, PO Take 5,000 Units by mouth daily.     No current facility-administered medications for this visit.     Allergies:   Morphine; Nifedipine; Darvon [propoxyphene]; Tramadol; Amoxicillin; and Amoxicillin-pot clavulanate   Social History:  The patient  reports that he quit smoking about 18 years ago. He has quit using smokeless tobacco. He reports that he drinks about 0.6 oz of alcohol per week . He reports that he does not use drugs.   Family History:  The patient's  family history includes Emphysema in his brother; Hypertension in his mother and other; Stroke in his brother.    ROS:  Please see the history of present illness.  Otherwise, review of systems is positive for Visual disturbance, anxiety.  All other systems are reviewed and negative.    PHYSICAL EXAM: VS:  BP (!) 160/80   Pulse 82   Ht _0  (1.753 m)   Wt 211 lb 1.9 oz (95.8 kg)   BMI 31.18 kg/m  , BMI Body mass index is 31.18 kg/m. GEN: Well nourished, well developed, in no acute distress  HEENT: normal  Neck: no JVD, no masses. No carotid bruits Cardiac: RRR without murmur or gallop                Respiratory:  clear to  auscultation bilaterally, normal work of breathing GI: soft, nontender, nondistended, + BS MS: no deformity or atrophy  Ext: no pretibial edema, pedal pulses 2+= bilaterally Skin: warm and dry, no rash Neuro:  Strength and sensation are intact Psych: euthymic mood, full affect  EKG:  EKG is not ordered today.  Recent Labs: No results found for requested labs within  last 8760 hours.   Lipid Panel     Component Value Date/Time   CHOL 111 09/19/2007 0850   TRIG 102 09/19/2007 0850   HDL 29.3 (L) 09/19/2007 0850   CHOLHDL 3.8 CALC 09/19/2007 0850   VLDL 20 09/19/2007 0850   LDLCALC 61 09/19/2007 0850      Wt Readings from Last 3 Encounters:  08/11/16 211 lb 1.9 oz (95.8 kg)  02/07/16 219 lb 1.9 oz (99.4 kg)  12/11/15 225 lb (102.1 kg)     Cardiac Studies Reviewed: 2D Echo 02-01-2015: Study Conclusions  - Left ventricle: The cavity size was normal. There was mild  concentric hypertrophy. Systolic function was normal. The  estimated ejection fraction was in the range of 55% to 60%. Wall  motion was normal; there were no regional wall motion  abnormalities. - Left atrium: The atrium was mildly dilated. - Atrial septum: No defect or patent foramen ovale was identified. - Pulmonary arteries: PA peak pressure: 35 mm Hg (S). - Pericardium, extracardiac: A trivial pericardial effusion was  identified.  Renal arterial duplex 04/19/2015: Widely patent renal arteries without stenosis  ASSESSMENT AND PLAN: 1.  CAD, native vessel: no angina. Medications reviewed and will be continued.  2. Malignant hypertension, uncontrolled: Patient is on multiple antihypertensive medications and is essentially taking the maximally tolerated dose and number of medicines he can take. Will continue to work on lifestyle modification, weight loss, low salt diet, and adherence to his medical regimen.  3. Hyperlipidemia: He continues on atorvastatin.  4. Carotid stenosis without history of  stroke: I reviewed his carotid Doppler studies which showed mild plaquing without significant flow obstruction. This can be followed clinically.  Overall he appears stable. He will follow-up in 3 months as he plans to move to [place] and he would like to be seen one more time prior to his move.  Current medicines are reviewed with the patient today.  The patient does not have concerns regarding medicines.  Labs/ tests ordered today include:  No orders of the defined types were placed in this encounter.  Disposition:   FU 3 months  Signed, Sherren Mocha, MD  08/11/2016 11:21 AM    [facility] [place], [place], [place]  [address] Phone: [phone]; Fax: [phone]

## 2016-09-08 ENCOUNTER — Encounter: Payer: Self-pay | Admitting: Cardiovascular Disease

## 2016-09-08 ENCOUNTER — Encounter: Payer: Self-pay | Admitting: Neurology

## 2016-09-15 ENCOUNTER — Telehealth: Payer: Self-pay | Admitting: Neurology

## 2016-09-15 NOTE — Telephone Encounter (Signed)
Austin [name] [phone] called wanting to know if Dr D was going to write a letter for disability. Please call

## 2016-09-16 NOTE — Telephone Encounter (Signed)
I would be happy to write a supportive letter, but need his attorney's to give me the details that should be highlighted.

## 2016-09-16 NOTE — Telephone Encounter (Signed)
FaX  To [phone].   "I am supporting [name]'s claim for disability. 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

## 2016-09-16 NOTE — Telephone Encounter (Signed)
Letter signed by Dr. Brett Fairy and faxed to the number she provided. Received a receipt of confirmation.

## 2016-09-16 NOTE — Telephone Encounter (Signed)
I'm not sure that I feel comfortable getting involved with speaking to his lawyers. If these lawyers speak with you, I do not mind helping write the letter.

## 2016-10-16 ENCOUNTER — Encounter: Payer: Self-pay | Admitting: Cardiovascular Disease

## 2016-10-16 NOTE — Telephone Encounter (Signed)
F/U Call:  Patient is calling back in regards to a letter for his attorney for SSDI. Patient sent an email through [place]. Thanks.

## 2016-10-19 ENCOUNTER — Encounter: Payer: Self-pay | Admitting: Cardiovascular Disease

## 2016-11-06 ENCOUNTER — Encounter: Payer: Self-pay | Admitting: *Deleted

## 2016-11-06 ENCOUNTER — Ambulatory Visit (INDEPENDENT_AMBULATORY_CARE_PROVIDER_SITE_OTHER): Admitting: Cardiovascular Disease

## 2016-11-06 ENCOUNTER — Encounter: Payer: Self-pay | Admitting: Cardiovascular Disease

## 2016-11-06 VITALS — BP 140/76 | HR 66 | Ht 68.5 in | Wt 210.4 lb

## 2016-11-06 DIAGNOSIS — Z01812 Encounter for preprocedural laboratory examination: Secondary | ICD-10-CM | POA: Diagnosis not present

## 2016-11-06 DIAGNOSIS — I25119 Atherosclerotic heart disease of native coronary artery with unspecified angina pectoris: Secondary | ICD-10-CM | POA: Diagnosis not present

## 2016-11-06 NOTE — Progress Notes (Signed)
 Cardiology Office Note Date:  11/07/2016   ID:  Austin [name], DOB 09/28/1955, MRN [id number]  PCP:  Shaw, William, MD  Cardiologist:  Asael Pann, MD    Chief Complaint  Patient presents with  . Essential hypertension,malignant  . Chest Pain     History of Present Illness: Austin [name] is a 61 y.o. male who presents for follow-up of CAD and malignant hypertension. The patient has coronary artery disease and underwent stenting of the right coronary artery in 2011 after presenting with atypical angina. He has severe hypertension and is maintained on multiple antihypertensive medications, difficult to control.   The patient is here alone today. He's been under a lot of stress currently in transition from [place] to [place]. He's been doing some work in his house ready to put on the more. He has experienced increasing chest discomfort. Describes an aching pain in the left upper arm and occasionally" sternal area." Also has had left neck pain at times. Symptoms have been responsive to rest. Has taken NTG, but not sure whether it has helped much. Complains of headache when he takes NTG. gives headache. No shortness of breath.   Past Medical History:  Diagnosis Date  . Bell's palsy 07/2009  . CAD (coronary artery disease)    a. LHC 10/11:  LAD 40-50%; CFX 30%; OM2 30%; RCA 70-80% (tx with DES), 50-60% distal/ Promus DES to RCA 06/05/2010 ([facility] trial)/ EF 60% on cath;  b.  Myoview 2/12: No ischemia, mild apical thinning, EF 55%;   c.  Echo 4/11: Mild focal basal and mild concentric hypertrophy the septum, EF 60-65%, grade 2 diastolic dysfunction, mild LAE  . Carotid stenosis 02/2007   a.  0-39% bilateral; b. dopplers 05/2010: 40-59% RICA; 0-39% LICA;  c. dopplers 09/2012:  40-59% bilat ICA (repeat 1 year)  . Diabetes mellitus    SINCE 2004  . Dyspnea 05/2008   Myoview; TM/myoview 63% no ischemia/ scar  . Hyperlipidemia   . Hypertension   . Insomnia   . Obesity     . Pancreatitis   . PTSD (post-traumatic stress disorder)   . Seasonal allergies   . 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 APPENDECTOMY    . CARDIAC CATHETERIZATION     LAD 40-50%; CFX 30%; OM2 30%; RCA 70-80% (tx with DES), 50-60% distal  . CERVICAL SPINE SURGERY     2006  . CHOLECYSTECTOMY  09/16/2012   Procedure: LAPAROSCOPIC CHOLECYSTECTOMY WITH INTRAOPERATIVE CHOLANGIOGRAM;  Surgeon: Todd J Rosenbower, MD;  Location: [facility];  Service: General;  Laterality: N/A;  . LUMBAR DISC SURGERY     C5-6  . TONSILLECTOMY      Current Outpatient Prescriptions  Medication Sig Dispense Refill  . amLODipine-benazepril (LOTREL) 10-40 MG capsule Take 1 capsule by mouth daily.    . Ascorbic Acid (VITAMIN C) 1000 MG tablet Take 1,000 mg by mouth daily.    . aspirin 81 MG tablet Take 81 mg by mouth daily.    . atorvastatin (LIPITOR) 20 MG tablet Take 20 mg by mouth daily.    . Azilsartan Medoxomil (EDARBI) 80 MG TABS Take 80 mg by mouth daily.    . B Complex-C (SUPER B COMPLEX PO) Take 1 tablet by mouth daily.      . carvedilol (COREG CR) 40 MG 24 hr capsule Take 1 capsule (40 mg total) by mouth daily. 90   capsule 3  . clonazePAM (KLONOPIN) 0.5 MG tablet Take 0.5 mg by mouth 2 (two) times daily.    . cloNIDine (CATAPRES) 0.2 MG tablet Take 0.2 mg by mouth 6 (six) times daily.    . Coenzyme Q10 (COQ-10) 400 MG CAPS Take 400 mg by mouth daily.    . Empagliflozin (JARDIANCE PO) Take 1 tablet by mouth daily.    . furosemide (LASIX) 40 MG tablet Take 40 mg by mouth daily.    . glyBURIDE-metformin (GLUCOVANCE) 2.5-500 MG per tablet Take 2 tablets by mouth 2 (two) times daily.      . insulin aspart protamine- aspart (NOVOLOG MIX 70/30) (70-30) 100 UNIT/ML injection Inject 15-30 Units into the skin daily as needed (pt only uses for real sugar or carbs, at most once a month injection.).    . levocetirizine  (XYZAL) 5 MG tablet Take 5 mg by mouth every evening.    . minoxidil (LONITEN) 10 MG tablet TAKE 2 TABLETS TWICE A DAY 360 tablet 2  . minoxidil (LONITEN) 10 MG tablet Take 20 mg by mouth 2 (two) times daily.    . NITROSTAT 0.4 MG SL tablet DISSOLVE 1 TABLET UNDER THE TONGUE EVERY 5 MINUTES AS NEEDED FOR CHEST PAIN 25 tablet 1  . Omega-3 Fatty Acids (FISH OIL) 1200 MG CAPS Take 1 capsule by mouth daily.     . potassium chloride SA (K-DUR,KLOR-CON) 20 MEQ tablet Take 20 mEq by mouth 2 (two) times daily.    . SIMBRINZA 1-0.2 % SUSP Place 1 drop into both eyes 3 (three) times daily.     . VITAMIN D, ERGOCALCIFEROL, PO Take 5,000 Units by mouth daily.     No current facility-administered medications for this visit.     Allergies:   Morphine; Nifedipine; Darvon [propoxyphene]; Tramadol; Amoxicillin; and Amoxicillin-pot clavulanate   Social History:  The patient  reports that he quit smoking about 18 years ago. He has quit using smokeless tobacco. He reports that he drinks about 0.6 oz of alcohol per week . He reports that he does not use drugs.   Family History:  The patient's  family history includes Emphysema in his brother; Hypertension in his mother and other; Stroke in his brother.    ROS:  Please see the history of present illness.  Otherwise, review of systems is positive for depression, balance problems.  All other systems are reviewed and negative.    PHYSICAL EXAM: VS:  BP 140/76   Pulse 66   Ht 5' 8.5" (1.74 m)   Wt 210 lb 6.4 oz (95.4 kg)   BMI 31.53 kg/m  , BMI Body mass index is 31.53 kg/m. GEN: Well nourished, well developed, in no acute distress  HEENT: normal  Neck: no JVD, no masses. No carotid bruits Cardiac: RRR without murmur or gallop                Respiratory:  clear to auscultation bilaterally, normal work of breathing GI: soft, nontender, nondistended, + BS MS: no deformity or atrophy  Ext: no pretibial edema, pedal pulses 2+= bilaterally Skin: warm and dry,  no rash Neuro:  Strength and sensation are intact Psych: euthymic mood, full affect  EKG:  EKG is ordered today. The ekg ordered today shows NSR 66 bpm, possible inferior infarct age-undetermined.  Recent Labs: No results found for requested labs within last 8760 hours.   Lipid Panel     Component Value Date/Time   CHOL 111 09/19/2007 0850   TRIG 102   09/19/2007 0850   HDL 29.3 (L) 09/19/2007 0850   CHOLHDL 3.8 CALC 09/19/2007 0850   VLDL 20 09/19/2007 0850   LDLCALC 61 09/19/2007 0850      Wt Readings from Last 3 Encounters:  11/06/16 210 lb 6.4 oz (95.4 kg)  08/11/16 211 lb 1.9 oz (95.8 kg)  02/07/16 219 lb 1.9 oz (99.4 kg)     ASSESSMENT AND PLAN: 1.  CAD, native vessel, with angina: increasing symptoms noted. On maximal therapy - multiple medications. Discussed options of stress testing versus cath. With known CAD, inability to exercise on treadmill, favor definitive cath study. I have reviewed the risks, indications, and alternatives to cardiac catheterization, possible angioplasty, and stenting with the patient. Risks include but are not limited to bleeding, infection, vascular injury, stroke, myocardial infection, arrhythmia, kidney injury, radiation-related injury in the case of prolonged fluoroscopy use, emergency cardiac surgery, and death. The patient understands the risks of serious complication is 1-2 in 1000 with diagnostic cardiac cath and 1-2% or less with angioplasty/stenting.   2. HTN, uncontrolled: extensive medication list. Uses additional clonidine as needed. Overall pattern is stable. Would like to DC edarbi as it hasn't improved BP control and it's expensive. Will stop this and he will remain on other medicines.  3. Hyperlipidemia: atorvastatin  Current medicines are reviewed with the patient today.  The patient does not have concerns regarding medicines.  Labs/ tests ordered today include:   Orders Placed This Encounter  Procedures  . Basic metabolic  panel  . CBC  . Protime-INR  . EKG 12-Lead    Disposition:   FU pending cath results  Signed, Peggyann Zwiefelhofer, MD  11/07/2016 4:33 AM    [facility] [address], [place], [place]  [address] Phone: [phone]; Fax: [phone]  

## 2016-11-06 NOTE — Patient Instructions (Signed)
Your physician recommends that you continue on your current medications as directed. Please refer to the Current Medication list given to you today. Your physician has requested that you have a cardiac catheterization. Cardiac catheterization is used to diagnose and/or treat various heart conditions. Doctors may recommend this procedure for a number of different reasons. The most common reason is to evaluate chest pain. Chest pain can be a symptom of coronary artery disease (CAD), and cardiac catheterization can show whether plaque is narrowing or blocking your heart's arteries. This procedure is also used to evaluate the valves, as well as measure the blood flow and oxygen levels in different parts of your heart. For further information please visit [email or web address]. Please follow instruction sheet, as given.

## 2016-11-16 ENCOUNTER — Other Ambulatory Visit: Payer: PRIVATE HEALTH INSURANCE | Admitting: *Deleted

## 2016-11-16 DIAGNOSIS — I25119 Atherosclerotic heart disease of native coronary artery with unspecified angina pectoris: Secondary | ICD-10-CM

## 2016-11-16 DIAGNOSIS — Z01812 Encounter for preprocedural laboratory examination: Secondary | ICD-10-CM

## 2016-11-17 LAB — BASIC METABOLIC PANEL
BUN / CREAT RATIO: 22 (ref 10–24)
BUN: 17 mg/dL (ref 8–27)
CHLORIDE: 101 mmol/L (ref 96–106)
CO2: 24 mmol/L (ref 18–29)
Calcium: 9.7 mg/dL (ref 8.6–10.2)
Creatinine, Ser: 0.77 mg/dL (ref 0.76–1.27)
GFR calc Af Amer: 113 mL/min/{1.73_m2} (ref >59)
GFR calc non Af Amer: 98 mL/min/{1.73_m2} (ref >59)
GLUCOSE: 151 mg/dL — AB (ref 65–99)
POTASSIUM: 4.6 mmol/L (ref 3.5–5.2)
Sodium: 144 mmol/L (ref 134–144)

## 2016-11-17 LAB — PROTIME-INR
INR: 1 (ref 0.8–1.2)
Prothrombin Time: 10.7 s (ref 9.1–12.0)

## 2016-11-17 LAB — CBC
Hematocrit: 40.5 % (ref 37.5–51.0)
Hemoglobin: 13.7 g/dL (ref 13.0–17.7)
MCH: 28.7 pg (ref 26.6–33.0)
MCHC: 33.8 g/dL (ref 31.5–35.7)
MCV: 85 fL (ref 79–97)
PLATELETS: 257 10*3/uL (ref 150–379)
RBC: 4.77 x10E6/uL (ref 4.14–5.80)
RDW: 13.9 % (ref 12.3–15.4)
WBC: 6.5 10*3/uL (ref 3.4–10.8)

## 2016-11-24 ENCOUNTER — Encounter (HOSPITAL_COMMUNITY): Payer: Self-pay | Admitting: Cardiovascular Disease

## 2016-11-24 ENCOUNTER — Ambulatory Visit (HOSPITAL_COMMUNITY)
Admission: RE | Admit: 2016-11-24 | Discharge: 2016-11-24 | Disposition: A | Discharge status: Home / Self Care | Source: Ambulatory Visit | Attending: Cardiovascular Disease | Admitting: Cardiovascular Disease

## 2016-11-24 ENCOUNTER — Ambulatory Visit (HOSPITAL_COMMUNITY)
Admission: RE | Disposition: A | Discharge status: Home / Self Care | Payer: Self-pay | Source: Ambulatory Visit | Attending: Cardiovascular Disease

## 2016-11-24 DIAGNOSIS — Z87891 Personal history of nicotine dependence: Secondary | ICD-10-CM | POA: Insufficient documentation

## 2016-11-24 DIAGNOSIS — Z885 Allergy status to narcotic agent status: Secondary | ICD-10-CM | POA: Insufficient documentation

## 2016-11-24 DIAGNOSIS — G4700 Insomnia, unspecified: Secondary | ICD-10-CM | POA: Diagnosis not present

## 2016-11-24 DIAGNOSIS — Z955 Presence of coronary angioplasty implant and graft: Secondary | ICD-10-CM | POA: Diagnosis not present

## 2016-11-24 DIAGNOSIS — Z880 Allergy status to penicillin: Secondary | ICD-10-CM | POA: Insufficient documentation

## 2016-11-24 DIAGNOSIS — I25119 Atherosclerotic heart disease of native coronary artery with unspecified angina pectoris: Secondary | ICD-10-CM | POA: Diagnosis present

## 2016-11-24 DIAGNOSIS — Z7982 Long term (current) use of aspirin: Secondary | ICD-10-CM | POA: Insufficient documentation

## 2016-11-24 DIAGNOSIS — Z8249 Family history of ischemic heart disease and other diseases of the circulatory system: Secondary | ICD-10-CM | POA: Diagnosis not present

## 2016-11-24 DIAGNOSIS — Z7984 Long term (current) use of oral hypoglycemic drugs: Secondary | ICD-10-CM | POA: Diagnosis not present

## 2016-11-24 DIAGNOSIS — E785 Hyperlipidemia, unspecified: Secondary | ICD-10-CM | POA: Insufficient documentation

## 2016-11-24 DIAGNOSIS — F4310 Post-traumatic stress disorder, unspecified: Secondary | ICD-10-CM | POA: Diagnosis not present

## 2016-11-24 DIAGNOSIS — Z6831 Body mass index (BMI) 31.0-31.9, adult: Secondary | ICD-10-CM | POA: Insufficient documentation

## 2016-11-24 DIAGNOSIS — E669 Obesity, unspecified: Secondary | ICD-10-CM | POA: Diagnosis not present

## 2016-11-24 DIAGNOSIS — E119 Type 2 diabetes mellitus without complications: Secondary | ICD-10-CM | POA: Insufficient documentation

## 2016-11-24 DIAGNOSIS — G4736 Sleep related hypoventilation in conditions classified elsewhere: Secondary | ICD-10-CM | POA: Diagnosis not present

## 2016-11-24 DIAGNOSIS — I10 Essential (primary) hypertension: Secondary | ICD-10-CM | POA: Diagnosis not present

## 2016-11-24 HISTORY — PX: LEFT HEART CATH AND CORONARY ANGIOGRAPHY: CATH118249

## 2016-11-24 HISTORY — PX: INTRAVASCULAR PRESSURE WIRE/FFR STUDY: CATH118243

## 2016-11-24 LAB — POCT ACTIVATED CLOTTING TIME: Activated Clotting Time: 208 seconds

## 2016-11-24 LAB — GLUCOSE, CAPILLARY
GLUCOSE-CAPILLARY: 202 mg/dL — AB (ref 65–99)
GLUCOSE-CAPILLARY: 191 mg/dL — AB (ref 65–99)

## 2016-11-24 SURGERY — LEFT HEART CATH AND CORONARY ANGIOGRAPHY
Anesthesia: LOCAL

## 2016-11-24 MED ORDER — LIDOCAINE HCL (PF) 1 % IJ SOLN
INTRAMUSCULAR | Status: AC
  Filled 2016-11-24: qty 30

## 2016-11-24 MED ORDER — SODIUM CHLORIDE 0.9% FLUSH: 3.0000 mL | INTRAVENOUS | Status: DC | PRN

## 2016-11-24 MED ORDER — HEPARIN (PORCINE) IN NACL 2-0.9 UNIT/ML-% IJ SOLN
INTRAMUSCULAR | Status: DC | PRN
  Administered 2016-11-24: 1000 mL

## 2016-11-24 MED ORDER — HYDROMORPHONE HCL 1 MG/ML IJ SOLN
INTRAMUSCULAR | Status: DC | PRN
  Administered 2016-11-24 (×2): 1 mg via INTRAVENOUS

## 2016-11-24 MED ORDER — VERAPAMIL HCL 2.5 MG/ML IV SOLN
INTRAVENOUS | Status: DC | PRN
  Administered 2016-11-24: 10 mL via INTRA_ARTERIAL

## 2016-11-24 MED ORDER — ADENOSINE 12 MG/4ML IV SOLN
INTRAVENOUS | Status: AC
  Filled 2016-11-24: qty 4

## 2016-11-24 MED ORDER — ADENOSINE (DIAGNOSTIC) 140MCG/KG/MIN
INTRAVENOUS | Status: DC | PRN
  Administered 2016-11-24: 140 ug/kg/min via INTRAVENOUS

## 2016-11-24 MED ORDER — HEPARIN SODIUM (PORCINE) 1000 UNIT/ML IJ SOLN
INTRAMUSCULAR | Status: AC
  Filled 2016-11-24: qty 1

## 2016-11-24 MED ORDER — SODIUM CHLORIDE 0.9 % IV SOLN: 250.0000 mL | INTRAVENOUS | Status: DC | PRN

## 2016-11-24 MED ORDER — SODIUM CHLORIDE 0.9 % WEIGHT BASED INFUSION
3.0000 mL/kg/h | INTRAVENOUS | Status: AC
  Administered 2016-11-24: 3 mL/kg/h via INTRAVENOUS

## 2016-11-24 MED ORDER — HEPARIN (PORCINE) IN NACL 2-0.9 UNIT/ML-% IJ SOLN
INTRAMUSCULAR | Status: AC
  Filled 2016-11-24: qty 1000

## 2016-11-24 MED ORDER — HYDROMORPHONE HCL 1 MG/ML IJ SOLN
INTRAMUSCULAR | Status: AC
  Filled 2016-11-24: qty 1

## 2016-11-24 MED ORDER — MIDAZOLAM HCL 2 MG/2ML IJ SOLN
INTRAMUSCULAR | Status: AC
  Filled 2016-11-24: qty 2

## 2016-11-24 MED ORDER — SODIUM CHLORIDE 0.9% FLUSH: 3.0000 mL | Freq: Two times a day (BID) | INTRAVENOUS | Status: DC

## 2016-11-24 MED ORDER — ACETAMINOPHEN 325 MG PO TABS: 650.0000 mg | ORAL_TABLET | ORAL | Status: DC | PRN

## 2016-11-24 MED ORDER — ONDANSETRON HCL 4 MG/2ML IJ SOLN: 4.0000 mg | Freq: Four times a day (QID) | INTRAMUSCULAR | Status: DC | PRN

## 2016-11-24 MED ORDER — SODIUM CHLORIDE 0.9 % WEIGHT BASED INFUSION: 1.0000 mL/kg/h | INTRAVENOUS | Status: DC

## 2016-11-24 MED ORDER — SODIUM CHLORIDE 0.9% FLUSH
3.0000 mL | Freq: Two times a day (BID) | INTRAVENOUS | Status: DC
Start: 2016-11-24 — End: 2016-11-24

## 2016-11-24 MED ORDER — IOPAMIDOL (ISOVUE-370) INJECTION 76%
INTRAVENOUS | Status: DC | PRN
  Administered 2016-11-24: 120 mL via INTRA_ARTERIAL

## 2016-11-24 MED ORDER — ASPIRIN 81 MG PO CHEW
CHEWABLE_TABLET | ORAL | Status: AC
  Administered 2016-11-24: 81 mg via ORAL
  Filled 2016-11-24: qty 1

## 2016-11-24 MED ORDER — ASPIRIN 81 MG PO CHEW
81.0000 mg | CHEWABLE_TABLET | ORAL | Status: AC
  Administered 2016-11-24: 81 mg via ORAL

## 2016-11-24 MED ORDER — HEPARIN SODIUM (PORCINE) 1000 UNIT/ML IJ SOLN
INTRAMUSCULAR | Status: DC | PRN
  Administered 2016-11-24 (×3): 5000 [IU] via INTRAVENOUS

## 2016-11-24 MED ORDER — MIDAZOLAM HCL 2 MG/2ML IJ SOLN
INTRAMUSCULAR | Status: DC | PRN
  Administered 2016-11-24 (×2): 2 mg via INTRAVENOUS

## 2016-11-24 MED ORDER — VERAPAMIL HCL 2.5 MG/ML IV SOLN
INTRAVENOUS | Status: AC
  Filled 2016-11-24: qty 2

## 2016-11-24 MED ORDER — IOPAMIDOL (ISOVUE-370) INJECTION 76%
INTRAVENOUS | Status: AC
  Filled 2016-11-24: qty 100

## 2016-11-24 SURGICAL SUPPLY — 16 items
CATH 5FR JL3.5 JR4 ANG PIG MP (CATHETERS) ×1 IMPLANT
CATH EXPO 5FR FL4 (CATHETERS) ×1 IMPLANT
CATH MICROCATH NAVVUS (MICROCATHETER) IMPLANT
CATH VISTA GUIDE 6FR XBLAD3.5 (CATHETERS) ×1 IMPLANT
DEVICE RAD COMP TR BAND LRG (VASCULAR PRODUCTS) ×1 IMPLANT
GLIDESHEATH SLEND SS 6F .021 (SHEATH) ×1 IMPLANT
GUIDEWIRE INQWIRE 1.5J.035X260 (WIRE) IMPLANT
INQWIRE 1.5J .035X260CM (WIRE) ×2
KIT ESSENTIALS PG (KITS) ×1 IMPLANT
KIT HEART LEFT (KITS) ×2 IMPLANT
MICROCATHETER NAVVUS (MICROCATHETER) ×2
PACK CARDIAC CATHETERIZATION (CUSTOM PROCEDURE TRAY) ×2 IMPLANT
SYR MEDRAD MARK V 150ML (SYRINGE) ×2 IMPLANT
TRANSDUCER W/STOPCOCK (MISCELLANEOUS) ×2 IMPLANT
TUBING CIL FLEX 10 FLL-RA (TUBING) ×2 IMPLANT
WIRE COUGAR XT STRL 190CM (WIRE) ×1 IMPLANT

## 2016-11-24 NOTE — Progress Notes (Signed)
Pt ambulated to bathroom and in hall without difficulty, standby assist. Voided without difficulty.

## 2016-11-24 NOTE — H&P (View-Only) (Signed)
Cardiology Office Note Date:  11/07/2016   ID:  Austin [name], DOB 1955/11/28, MRN [id number]  PCP:  Marton Redwood, MD  Cardiologist:  Sherren Mocha, MD    Chief Complaint  Patient presents with  . Essential hypertension,malignant  . Chest Pain     History of Present Illness: Austin [name] is a 61 y.o. male who presents for follow-up of CAD and malignant hypertension. The patient has coronary artery disease and underwent stenting of the right coronary artery in 2011 after presenting with atypical angina. He has severe hypertension and is maintained on multiple antihypertensive medications, difficult to control.   The patient is here alone today. He's been under a lot of stress currently in transition from [place] to [place]. He's been doing some work in his house ready to put on the more. He has experienced increasing chest discomfort. Describes an aching pain in the left upper arm and occasionally" sternal area." Also has had left neck pain at times. Symptoms have been responsive to rest. Has taken NTG, but not sure whether it has helped much. Complains of headache when he takes NTG. gives headache. No shortness of breath.   Past Medical History:  Diagnosis Date  . Bell's palsy 07/2009  . CAD (coronary artery disease)    a. LHC 10/11:  LAD 40-50%; CFX 30%; OM2 30%; RCA 70-80% (tx with DES), 50-60% distal/ Promus DES to RCA 06/05/2010 Austin [name] trial)/ EF 60% on cath;  b.  Myoview 2/12: No ischemia, mild apical thinning, EF 55%;   c.  Echo 4/11: Mild focal basal and mild concentric hypertrophy the septum, EF 78-29%, grade 2 diastolic dysfunction, mild LAE  . Carotid stenosis 02/2007   a.  0-39% bilateral; b. dopplers 05/2010: 56-21% RICA; 3-08% LICA;  c. dopplers 01/5783:  40-59% bilat ICA (repeat 1 year)  . Diabetes mellitus    SINCE 2004  . Dyspnea 05/2008   Myoview; TM/myoview 63% no ischemia/ scar  . Hyperlipidemia   . Hypertension   . Insomnia   . Obesity     . Pancreatitis   . PTSD (post-traumatic stress disorder)   . Seasonal allergies   . 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 APPENDECTOMY    . CARDIAC CATHETERIZATION     LAD 40-50%; CFX 30%; OM2 30%; RCA 70-80% (tx with DES), 50-60% distal  . CERVICAL SPINE SURGERY     2006  . CHOLECYSTECTOMY  09/16/2012   Procedure: LAPAROSCOPIC CHOLECYSTECTOMY WITH INTRAOPERATIVE CHOLANGIOGRAM;  Surgeon: Odis Hollingshead, MD;  Location: [facility];  Service: General;  Laterality: N/A;  . LUMBAR DISC SURGERY     C5-6  . TONSILLECTOMY      Current Outpatient Prescriptions  Medication Sig Dispense Refill  . amLODipine-benazepril (LOTREL) 10-40 MG capsule Take 1 capsule by mouth daily.    . Ascorbic Acid (VITAMIN C) 1000 MG tablet Take 1,000 mg by mouth daily.    Marland Kitchen aspirin 81 MG tablet Take 81 mg by mouth daily.    Marland Kitchen atorvastatin (LIPITOR) 20 MG tablet Take 20 mg by mouth daily.    . Azilsartan Medoxomil (EDARBI) 80 MG TABS Take 80 mg by mouth daily.    . B Complex-C (SUPER B COMPLEX PO) Take 1 tablet by mouth daily.      . carvedilol (COREG CR) 40 MG 24 hr capsule Take 1 capsule (40 mg total) by mouth daily. Welch  capsule 3  . clonazePAM (KLONOPIN) 0.5 MG tablet Take 0.5 mg by mouth 2 (two) times daily.    . cloNIDine (CATAPRES) 0.2 MG tablet Take 0.2 mg by mouth 6 (six) times daily.    . Coenzyme Q10 (COQ-10) 400 MG CAPS Take 400 mg by mouth daily.    . Empagliflozin (JARDIANCE PO) Take 1 tablet by mouth daily.    . furosemide (LASIX) 40 MG tablet Take 40 mg by mouth daily.    Marland Kitchen glyBURIDE-metformin (GLUCOVANCE) 2.5-500 MG per tablet Take 2 tablets by mouth 2 (two) times daily.      . insulin aspart protamine- aspart (NOVOLOG MIX 70/30) (70-30) 100 UNIT/ML injection Inject 15-30 Units into the skin daily as needed (pt only uses for real sugar or carbs, at most once a month injection.).    Marland Kitchen levocetirizine  (XYZAL) 5 MG tablet Take 5 mg by mouth every evening.    . minoxidil (LONITEN) 10 MG tablet TAKE 2 TABLETS TWICE A DAY 360 tablet 2  . minoxidil (LONITEN) 10 MG tablet Take 20 mg by mouth 2 (two) times daily.    Marland Kitchen NITROSTAT 0.4 MG SL tablet DISSOLVE 1 TABLET UNDER THE TONGUE EVERY 5 MINUTES AS NEEDED FOR CHEST PAIN 25 tablet 1  . Omega-3 Fatty Acids (FISH OIL) 1200 MG CAPS Take 1 capsule by mouth daily.     . potassium chloride SA (K-DUR,KLOR-CON) 20 MEQ tablet Take 20 mEq by mouth 2 (two) times daily.    Marland Kitchen SIMBRINZA 1-0.2 % SUSP Place 1 drop into both eyes 3 (three) times daily.     Marland Kitchen VITAMIN D, ERGOCALCIFEROL, PO Take 5,000 Units by mouth daily.     No current facility-administered medications for this visit.     Allergies:   Morphine; Nifedipine; Darvon [propoxyphene]; Tramadol; Amoxicillin; and Amoxicillin-pot clavulanate   Social History:  The patient  reports that he quit smoking about 18 years ago. He has quit using smokeless tobacco. He reports that he drinks about 0.6 oz of alcohol per week . He reports that he does not use drugs.   Family History:  The patient's  family history includes Emphysema in his brother; Hypertension in his mother and other; Stroke in his brother.    ROS:  Please see the history of present illness.  Otherwise, review of systems is positive for depression, balance problems.  All other systems are reviewed and negative.    PHYSICAL EXAM: VS:  BP 140/76   Pulse 66   Ht 5' 8.5" (1.74 m)   Wt 210 lb 6.4 oz (95.4 kg)   BMI 31.53 kg/m  , BMI Body mass index is 31.53 kg/m. GEN: Well nourished, well developed, in no acute distress  HEENT: normal  Neck: no JVD, no masses. No carotid bruits Cardiac: RRR without murmur or gallop                Respiratory:  clear to auscultation bilaterally, normal work of breathing GI: soft, nontender, nondistended, + BS MS: no deformity or atrophy  Ext: no pretibial edema, pedal pulses 2+= bilaterally Skin: warm and dry,  no rash Neuro:  Strength and sensation are intact Psych: euthymic mood, full affect  EKG:  EKG is ordered today. The ekg ordered today shows NSR 66 bpm, possible inferior infarct age-undetermined.  Recent Labs: No results found for requested labs within last 8760 hours.   Lipid Panel     Component Value Date/Time   CHOL 111 09/19/2007 0850   TRIG 102  09/19/2007 0850   HDL 29.3 (L) 09/19/2007 0850   CHOLHDL 3.8 CALC 09/19/2007 0850   VLDL 20 09/19/2007 0850   LDLCALC 61 09/19/2007 0850      Wt Readings from Last 3 Encounters:  11/06/16 210 lb 6.4 oz (95.4 kg)  08/11/16 211 lb 1.9 oz (95.8 kg)  02/07/16 219 lb 1.9 oz (99.4 kg)     ASSESSMENT AND PLAN: 1.  CAD, native vessel, with angina: increasing symptoms noted. On maximal therapy - multiple medications. Discussed options of stress testing versus cath. With known CAD, inability to exercise on treadmill, favor definitive cath study. I have reviewed the risks, indications, and alternatives to cardiac catheterization, possible angioplasty, and stenting with the patient. Risks include but are not limited to bleeding, infection, vascular injury, stroke, myocardial infection, arrhythmia, kidney injury, radiation-related injury in the case of prolonged fluoroscopy use, emergency cardiac surgery, and death. The patient understands the risks of serious complication is 1-2 in 0092 with diagnostic cardiac cath and 1-2% or less with angioplasty/stenting.   2. HTN, uncontrolled: extensive medication list. Uses additional clonidine as needed. Overall pattern is stable. Would like to DC edarbi as it hasn't improved BP control and it's expensive. Will stop this and he will remain on other medicines.  3. Hyperlipidemia: atorvastatin  Current medicines are reviewed with the patient today.  The patient does not have concerns regarding medicines.  Labs/ tests ordered today include:   Orders Placed This Encounter  Procedures  . Basic metabolic  panel  . CBC  . Protime-INR  . EKG 12-Lead    Disposition:   FU pending cath results  Signed, Sherren Mocha, MD  11/07/2016 4:33 AM    [facility] [place], [place], [place]  [address] Phone: [phone]; Fax: [phone]

## 2016-11-24 NOTE — Interval H&P Note (Signed)
History and Physical Interval Note:  11/24/2016 9:24 AM  Austin [name]  has presented today for surgery, with the diagnosis of cad with angina  The various methods of treatment have been discussed with the patient and family. After consideration of risks, benefits and other options for treatment, the patient has consented to  Procedure(s): Left Heart Cath and Coronary Angiography (N/A) as a surgical intervention .  The patient's history has been reviewed, patient examined, no change in status, stable for surgery.  I have reviewed the patient's chart and labs.  Questions were answered to the patient's satisfaction.     Sherren Mocha

## 2016-11-24 NOTE — Discharge Instructions (Signed)
Radial Site Care Refer to this sheet in the next few weeks. These instructions provide you with information about caring for yourself after your procedure. Your health care provider may also give you more specific instructions. Your treatment has been planned according to current medical practices, but problems sometimes occur. Call your health care provider if you have any problems or questions after your procedure. What can I expect after the procedure? After your procedure, it is typical to have the following:  Bruising at the radial site that usually fades within 1-2 weeks.  Blood collecting in the tissue (hematoma) that may be painful to the touch. It should usually decrease in size and tenderness within 1-2 weeks. Follow these instructions at home:  Take medicines only as directed by your health care provider.  You may shower 24-48 hours after the procedure or as directed by your health care provider. Remove the bandage (dressing) and gently wash the site with plain soap and water. Pat the area dry with a clean towel. Do not rub the site, because this may cause bleeding.  Do not take baths, swim, or use a hot tub until your health care provider approves.  Check your insertion site every day for redness, swelling, or drainage.  Do not apply powder or lotion to the site.  Do not flex or bend the affected arm for 24 hours or as directed by your health care provider.  Do not push or pull heavy objects with the affected arm for 24 hours or as directed by your health care provider.  Do not lift over 10 lb (4.5 kg) for 5 days after your procedure or as directed by your health care provider.  Ask your health care provider when it is okay to:  Return to work or school.  Resume usual physical activities or sports.  Resume sexual activity.  Do not drive home if you are discharged the same day as the procedure. Have someone else drive you.  You may drive 24 hours after the procedure  unless otherwise instructed by your health care provider.  Do not operate machinery or power tools for 24 hours after the procedure.  If your procedure was done as an outpatient procedure, which means that you went home the same day as your procedure, a responsible adult should be with you for the first 24 hours after you arrive home.  Keep all follow-up visits as directed by your health care provider. This is important. Contact a health care provider if:  You have a fever.  You have chills.  You have increased bleeding from the radial site. Hold pressure on the site. Get help right away if:  You have unusual pain at the radial site.  You have redness, warmth, or swelling at the radial site.  You have drainage (other than a small amount of blood on the dressing) from the radial site.  The radial site is bleeding, and the bleeding does not stop after 30 minutes of holding steady pressure on the site.  Your arm or hand becomes pale, cool, tingly, or numb. This information is not intended to replace advice given to you by your health care provider. Make sure you discuss any questions you have with your health care provider. Document Released: 09/05/2010 Document Revised: 01/09/2016 Document Reviewed: 02/19/2014 Elsevier Interactive Patient Education  2017 [employer].

## 2016-11-25 MED FILL — Lidocaine HCl Local Preservative Free (PF) Inj 1%: INTRAMUSCULAR | Qty: 30 | Status: AC

## 2016-12-10 ENCOUNTER — Ambulatory Visit: Admitting: Neurology

## 2016-12-16 ENCOUNTER — Ambulatory Visit (INDEPENDENT_AMBULATORY_CARE_PROVIDER_SITE_OTHER): Admitting: Neurology

## 2016-12-16 ENCOUNTER — Encounter: Payer: Self-pay | Admitting: Neurology

## 2016-12-16 ENCOUNTER — Telehealth: Payer: Self-pay

## 2016-12-16 VITALS — BP 159/80 | HR 61 | Ht 68.5 in | Wt 215.0 lb

## 2016-12-16 DIAGNOSIS — Z9981 Dependence on supplemental oxygen: Secondary | ICD-10-CM | POA: Diagnosis not present

## 2016-12-16 DIAGNOSIS — Z9989 Dependence on other enabling machines and devices: Secondary | ICD-10-CM

## 2016-12-16 DIAGNOSIS — G4733 Obstructive sleep apnea (adult) (pediatric): Secondary | ICD-10-CM | POA: Diagnosis not present

## 2016-12-16 NOTE — Progress Notes (Addendum)
S[facility] MED[facility]INE [facility]   Provider:  Larey Seat, M D  Referring Provider: Marton Redwood, MD Primary Care Physician:  Marton Redwood, MD  Chief Complaint  Patient presents with  . Follow-up    cpap    HPI:  Austin [name] is a 61 y.o. male ,seen here as a referral from Dr. 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 [name] for a evaluation of nocturnal hypoxemia. Austin [name] is a Caucasian left-handed male, who worked until recently as a PA.  Mr. Bulkley is seen today for follow-up on his CPAP compliance which has been excellent. ADDENDUM : Austin [name]'s download in the office was invalid. He contacted his DME and indeed a machine with a different serial number is linked to his name. I could hear his correct download the patient has used the machine 30 out of 30 days each night over 4 hours with 100% compliance and an average user time of 8 hours and 18 minutes. He is using an AutoSet between 7 and 15 cm water with 2 cm EPR, the residual AHI is 1.1. This is an excellent result he has very little air leaks and I have counseled the original referral to DME to check for air leaks as my recommendation was based on the wrong report. The DME was made aware of the mistaken download. The patient was called at home after the visit and the mistake was clarified and is hopefully corrected.    Interval history from 06-11-15,CD The patient underwent a split-night polysomnography on 04-02-15. Until this sleep study was obtained, the patient had never been found to have sleep apnea- however this time his AHI was 34.4, his RDI which includes arousals from snoring, was 38.5. In supine sleep his apnea index increased to 60 per hour and in REM sleep to 41.3.  The oxygen nadir for the study was 74% and he states began 113 minutes in desaturation. Interestingly his heart rate remained very stable the average was 74 bpm in sinus  rhythm. CPAP was initiated at 5 and step-by-step increased to 14 cm water. At this setting an AHI of 0.0 was reached. There were 10 central apneas induced during the CPAP titration. Oxygen desaturation was still evident with 83% nadir during the titration and 198 minutes of total time in desaturation. Periodic arousal index was 2.0. Heart rate again states stable this time at 68 beats per minutes on average. The study was interpreted as revealing by now a rather severe obstructive sleep apnea with REM accentuation and accentuation in supine sleep and associated with hypoxemia.  CPAP at 14 cm erased the AHI and the nadir of oxygen increased to 89% by the patient slept in supine position. Abdomen me rash quadruple fullface mask in medium size was used with heated humidity. We're following up today on this setting and the use of the machine for compliance.  Data obtained by my office on 06-05-15 revealed 100% of days of use and 97% compliance for over 4 hours of consecutive use at night average user time is 7 hours 8 minutes. This CPAP is an AUTO setting, and revealed a 95% pressure of 11.1 cm water. The patient used O2 at 2 L with nasal cannula prior to the sleep study which confirmed the presence of OSA as well as hypoxemia. The patient has been become very familiar with his CPAP and actually now feels that he sleeps so  much better that she craves the CPAP to some degree. He still bleeds 2 L of oxygen into the CPAP which and delivers it. He uses a full face mask by choice. I compared the data of his sleep study also was his last overnight pulse oximetry which was performed as an ONO without CPAP  on 2 L of oxygen on 01-27-15.  Here the patient had a desaturation time at or below 90% SPO2 of only 1 minute and 12 seconds. The patient reports that his sleep habits have changed under the CPAP therapy he often goes to bed earlier now he no longer sleeps in his recliner but actually goes to the bedroom and uses a  CPAP. He feels that he sleeps longer and sounder. When he wakes up in the morning he is more refreshed and restored. He still reports nightmarish cream content sometimes waking up diaphoretic sometimes also with palpitations. This however has been less frequent since he is on CPAP and he feels that the sounder sleep is also reflected in a decrease in PTSD symptoms at night.  Today's Epworth sleepiness score was still endorsed at 13 points which is elevated his most sleepy time is after lunch. He endorsed to points for sitting and reading, watching TV or when he is a passenger in a car. Only 1 point for sitting inactive in a public place talking to someone sitting quietly after lunch or when driving and waiting at a stop light.     Review of Systems: Out of a complete 14 system review, the patient complains of only the following symptoms, and all other reviewed systems are negative.  snoring, weight gain, PTSD interrupting his sleep with nightmares. Bells palsy - right eye droop.  Pulmonary hypertension.  Schizoid personality disorder.   Epworth score now 8 from 21 , Fatigue severity score  40 from 39  , depression score  4 .    Social History   Social History  . Marital status: Married    Spouse name: N/A  . Number of children: N/A  . Years of education: N/A   Occupational History  . PA [employer]    [employer]  . Retired    Social History Main Topics  . Smoking status: Former Smoker    Quit date: 01/21/1998  . Smokeless tobacco: Former Systems developer  . Alcohol use 0.6 oz/week    1 Standard drinks or equivalent per week  . Drug use: No  . Sexual activity: Not on file   Other Topics Concern  . Not on file   Social History Narrative   Disabled   Married   Former Scientist, clinical (histocompatibility and immunogenetics)   Regular exercise    Family History  Problem Relation Age of Onset  . Hypertension Mother   . Stroke Brother   . Hypertension Other   . Emphysema Brother     Past Medical  History:  Diagnosis Date  . Bell's palsy 07/2009  . CAD (coronary artery disease)    a. LHC 10/11:  LAD 40-50%; CFX 30%; OM2 30%; RCA 70-80% (tx with DES), 50-60% distal/ Promus DES to RCA 06/05/2010 [facility] trial)/ EF 60% on cath;  b.  Myoview 2/12: No ischemia, mild apical thinning, EF 55%;   c.  Echo 4/11: Mild focal basal and mild concentric hypertrophy the septum, EF 37-85%, grade 2 diastolic dysfunction, mild LAE  . Carotid stenosis 02/2007   a.  0-39% bilateral; b. dopplers 05/2010: 88-50% RICA; 2-77% LICA;  c. dopplers  09/2012:  40-59% bilat ICA (repeat 1 year)  . Diabetes mellitus    SINCE 2004  . Dyspnea 05/2008   Myoview; TM/myoview 63% no ischemia/ scar  . Hyperlipidemia   . Hypertension   . Insomnia   . Obesity   . Pancreatitis   . PTSD (post-traumatic stress disorder)   . Seasonal allergies   . 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 APPENDECTOMY    . CARDIAC CATHETERIZATION     LAD 40-50%; CFX 30%; OM2 30%; RCA 70-80% (tx with DES), 50-60% distal  . CERVICAL SPINE SURGERY     2006  . CHOLECYSTECTOMY  09/16/2012   Procedure: LAPAROSCOPIC CHOLECYSTECTOMY WITH INTRAOPERATIVE CHOLANGIOGRAM;  Surgeon: Odis Hollingshead, MD;  Location: [place];  Service: General;  Laterality: N/A;  . INTRAVASCULAR PRESSURE WIRE/FFR STUDY N/A 11/24/2016   Procedure: Intravascular Pressure Wire/FFR Study;  Surgeon: Sherren Mocha, MD;  Location: [place] [facility];  Service: Cardiovascular;  Laterality: N/A;  MID CFX  . LEFT HEART CATH AND CORONARY ANGIOGRAPHY N/A 11/24/2016   Procedure: Left Heart Cath and Coronary Angiography;  Surgeon: Sherren Mocha, MD;  Location: [place] [facility];  Service: Cardiovascular;  Laterality: N/A;  . LUMBAR DISC SURGERY     C5-6  . TONSILLECTOMY      Current Outpatient Prescriptions  Medication Sig Dispense Refill  . amLODipine-benazepril (LOTREL)  10-40 MG capsule Take 1 capsule by mouth daily.    . Ascorbic Acid (VITAMIN C) 1000 MG tablet Take 1,500 mg by mouth 2 (two) times daily.     Marland Kitchen aspirin 81 MG tablet Take 81 mg by mouth daily.    Marland Kitchen atorvastatin (LIPITOR) 20 MG tablet Take 20 mg by mouth daily.    . B Complex-C (SUPER B COMPLEX PO) Take 1 tablet by mouth daily.      . carvedilol (COREG CR) 40 MG 24 hr capsule Take 1 capsule (40 mg total) by mouth daily. 90 capsule 3  . celecoxib (CELEBREX) 100 MG capsule Take 100 mg by mouth at bedtime.    . Cholecalciferol (VITAMIN D-3) 5000 units TABS Take 1 tablet by mouth daily.    . clonazePAM (KLONOPIN) 0.5 MG tablet Take 0.5 mg by mouth 2 (two) times daily.    . cloNIDine (CATAPRES) 0.2 MG tablet Take 0.2 mg by mouth 6 (six) times daily.    . Coenzyme Q10 (COQ-10) 400 MG CAPS Take 400 mg by mouth daily.    . empagliflozin (JARDIANCE) 25 MG TABS tablet Take 25 mg by mouth daily.    . furosemide (LASIX) 40 MG tablet Take 40 mg by mouth daily.    Marland Kitchen glyBURIDE-metformin (GLUCOVANCE) 2.5-500 MG per tablet Take 1-2 tablets by mouth 2 (two) times daily.     . insulin aspart protamine- aspart (NOVOLOG MIX 70/30) (70-30) 100 UNIT/ML injection Inject 15-30 Units into the skin daily as needed (pt only uses for real sugar or carbs, at most once a month injection.).    Marland Kitchen levocetirizine (XYZAL) 5 MG tablet Take 5 mg by mouth every evening.    . minoxidil (LONITEN) 10 MG tablet TAKE 2 TABLETS TWICE A DAY 360 tablet 2  . NITROSTAT 0.4 MG SL tablet DISSOLVE 1 TABLET UNDER THE TONGUE EVERY 5 MINUTES AS NEEDED FOR CHEST PAIN 25 tablet 1  . potassium chloride SA (K-DUR,KLOR-CON) 20 MEQ tablet Take 20 mEq by mouth 2 (two) times daily.    Marland Kitchen  SIMBRINZA 1-0.2 % SUSP Place 1 drop into both eyes 3 (three) times daily.      No current facility-administered medications for this visit.     Allergies as of 12/16/2016 - Review Complete 12/16/2016  Allergen Reaction Noted  . Morphine Other (See Comments)   .  Nifedipine Other (See Comments)   . Darvon [propoxyphene] Itching 09/01/2012  . Tramadol Other (See Comments) 01/15/2012  . Amoxicillin Hives and Rash   . Amoxicillin-pot clavulanate Hives and Rash     Vitals: BP (!) 159/80   Pulse 61   Ht 5' 8.5" (1.74 m)   Wt 215 lb (97.5 kg)   BMI 32.22 kg/m  Last Weight:  Wt Readings from Last 1 Encounters:  12/16/16 215 lb (97.5 kg)       Last Height:   Ht Readings from Last 1 Encounters:  12/16/16 5' 8.5" (1.74 m)    Physical exam:  General: The patient is awake, alert and appears not in acute distress. The patient is well groomed. Head: Normocephalic, atraumatic. Neck is supple. Mallampati 3 ,  the left now the on is more narrowed than the right but appears patent, the mucous lining is reddened and swollen.  neck circumference:22. Nasal airflow as above  TMJ is not  evident . Retrognathia is not seen.  Cardiovascular:  Regular rate and rhythm , without  murmurs or carotid bruit, and without distended neck veins. Respiratory: Lungs are clear to auscultation. Skin:  Without evidence of edema, or rash. Trunk: BMI is  elevated and patient has normal posture. Anterior cervical fusion patient , low ROM in the neck and back pain.   Neurologic exam :The patient is awake and alert, oriented to place and time.   Memory subjective described as intact. There is a normal attention span & concentration ability.  Speech is fluent without  dysarthria, dysphonia or aphasia. Mood and affect are appropriate.  Cranial nerves: Pupils are equal and briskly reactive to light. Funduscopic exam without evidence of pallor or edema.  Right eye droopy, Bells palsy.  Extraocular movements  in vertical and horizontal planes intact and without nystagmus. Visual fields by finger perimetry are intact. Hearing to finger rub intact.  Facial sensation intact to fine touch. Facial motor strength is symmetric and tongue and uvula move midline. Motor exam:  Normal tone, muscle  bulk and symmetric ,strength in all extremities. Sensory:  Fine touch, pinprick and vibration were tested in all extremities. Proprioception is normal. Coordination: Rapid alternating movements in the fingers/hands is normal. Finger-to-nose maneuver normal without evidence of ataxia, dysmetria or tremor. Gait and station: Patient walks without assistive device and is able unassisted to climb up to the exam table. Strength within normal limits. Stance is stable and normal.  Deep tendon reflexes: in the  upper and lower extremities are symmetric and intact. Babinski maneuver response is downgoing.  Assessment:  After physical and neurologic examination, review of laboratory studies, imaging, neurophysiology testing and pre-existing records, assessment is :   1) OSA, severe with positional and REM accentuation. AHI is significantly reduced on CPAP but residual AHI was still high, likely inflated by airleak.   2)  Hypoxemia - appears resolved on O2 and now on CPAP with O2 . Dr Brigitte Pulse added oxygen 24/7 after a pulse oximetry- Pulmonary hypertension;  This in return will now facilitate his retirement    3)  PTSD, reflected in the nightmares and diaphoretic arousals. Flash backs in daytime and at night. Recently diagnosed as a loner-  schizoid personality disorder.   4 ) Bell's palsy- vision impairment due to ptosis. .    The patient was advised of the nature of the diagnosed sleep disorder , the treatment options and risks for general a health and wellness arising from not treating the condition.  Visit duration was 30 minutes. More than 50% of the face to face time was dedicated to the coordination of care with internist, pulmonologist ( Dr. [place] Bells ) , and [place] .   Plan:  Treatment plan and additional workup :     DME changed form [facility] to [facility] and continues CPAP aftercare through this DME. Marland Kitchen   Rv in 12 month.    Asencion Partridge Leyna Vanderkolk MD  12/16/2016  Cc  Carmie Kanner, MD

## 2016-12-16 NOTE — Telephone Encounter (Signed)
Pt noticed that the data from airview regarding his cpap is not accurate. He does not sleep 11 hours on average as the data reports. Upon further inspection, it appears that pt's name and DOB is correct, but the serial number on the pt's cpap machine is not matching the serial number in airview. The DME adds pt's information to airview, and therefore, a DME needs to fix it. Pt uses [facility]. I called [facility], spoke to [place], and advised her of this problem. I gave her the correct serial number. She added the pt to airview with the correct serial number and said that when he turns his machine on, within wifi range, it should update in airview.  I called pt. I explained this to him. Pt is agreeable to Dr. Brett Fairy reviewing the updated data and sending him a mychart message with any updates. I will ask Dr. Brett Fairy to review this data and I will send pt a mychart message.

## 2017-05-21 ENCOUNTER — Other Ambulatory Visit: Payer: Self-pay | Admitting: Cardiovascular Disease

## 2017-11-18 ENCOUNTER — Other Ambulatory Visit: Payer: Self-pay | Admitting: Cardiovascular Disease
